# Patient Record
Sex: Female | Born: 1960 | Race: White | Hispanic: No | Marital: Married | Smoking: Never smoker
Health system: Southern US, Community
[De-identification: ages and names within clinical notes are randomized; demographics above are authoritative.]

## PROBLEM LIST (undated history)

## (undated) DIAGNOSIS — K219 Gastro-esophageal reflux disease without esophagitis: Secondary | ICD-10-CM

## (undated) DIAGNOSIS — M3500 Sicca syndrome, unspecified: Secondary | ICD-10-CM

## (undated) DIAGNOSIS — R87619 Unspecified abnormal cytological findings in specimens from cervix uteri: Secondary | ICD-10-CM

## (undated) DIAGNOSIS — S92909A Unspecified fracture of unspecified foot, initial encounter for closed fracture: Secondary | ICD-10-CM

## (undated) DIAGNOSIS — M069 Rheumatoid arthritis, unspecified: Secondary | ICD-10-CM

## (undated) DIAGNOSIS — L920 Granuloma annulare: Secondary | ICD-10-CM

## (undated) DIAGNOSIS — R000 Tachycardia, unspecified: Secondary | ICD-10-CM

## (undated) DIAGNOSIS — O039 Complete or unspecified spontaneous abortion without complication: Secondary | ICD-10-CM

## (undated) HISTORY — DX: Unspecified abnormal cytological findings in specimens from cervix uteri: R87.619

## (undated) HISTORY — DX: Rheumatoid arthritis, unspecified: M06.9

## (undated) HISTORY — DX: Tachycardia, unspecified: R00.0

## (undated) HISTORY — DX: Granuloma annulare: L92.0

## (undated) HISTORY — DX: Gastro-esophageal reflux disease without esophagitis: K21.9

## (undated) HISTORY — DX: Complete or unspecified spontaneous abortion without complication: O03.9

## (undated) HISTORY — PX: TONSILLECTOMY: SUR1361

## (undated) HISTORY — PX: DILATION AND CURETTAGE OF UTERUS: SHX78

## (undated) HISTORY — DX: Unspecified fracture of unspecified foot, initial encounter for closed fracture: S92.909A

## (undated) HISTORY — PX: COLPOSCOPY: SHX161

## (undated) HISTORY — DX: Sjogren syndrome, unspecified: M35.00

---

## 1998-03-04 ENCOUNTER — Emergency Department (HOSPITAL_COMMUNITY): Admission: EM | Admit: 1998-03-04 | Discharge: 1998-03-04 | Payer: Self-pay | Admitting: Emergency Medicine

## 1998-12-05 ENCOUNTER — Emergency Department (HOSPITAL_COMMUNITY): Admission: EM | Admit: 1998-12-05 | Discharge: 1998-12-06 | Payer: Self-pay

## 2000-04-19 ENCOUNTER — Encounter: Admission: RE | Admit: 2000-04-19 | Discharge: 2000-04-19 | Payer: Self-pay | Admitting: Family Medicine

## 2000-04-19 ENCOUNTER — Encounter: Payer: Self-pay | Admitting: Family Medicine

## 2000-05-19 ENCOUNTER — Encounter: Admission: RE | Admit: 2000-05-19 | Discharge: 2000-05-19 | Payer: Self-pay | Admitting: Family Medicine

## 2000-05-19 ENCOUNTER — Encounter: Payer: Self-pay | Admitting: Family Medicine

## 2001-02-07 ENCOUNTER — Encounter: Payer: Self-pay | Admitting: Gastroenterology

## 2001-02-07 ENCOUNTER — Encounter: Admission: RE | Admit: 2001-02-07 | Discharge: 2001-02-07 | Payer: Self-pay | Admitting: Gastroenterology

## 2001-05-13 ENCOUNTER — Encounter: Admission: RE | Admit: 2001-05-13 | Discharge: 2001-05-13 | Payer: Self-pay | Admitting: *Deleted

## 2002-01-11 ENCOUNTER — Encounter: Admission: RE | Admit: 2002-01-11 | Discharge: 2002-01-11 | Payer: Self-pay | Admitting: Gastroenterology

## 2002-01-11 ENCOUNTER — Encounter: Payer: Self-pay | Admitting: Gastroenterology

## 2002-08-07 ENCOUNTER — Encounter: Admission: RE | Admit: 2002-08-07 | Discharge: 2002-08-07 | Payer: Self-pay | Admitting: *Deleted

## 2002-10-03 ENCOUNTER — Other Ambulatory Visit: Admission: RE | Admit: 2002-10-03 | Discharge: 2002-10-03 | Payer: Self-pay | Admitting: *Deleted

## 2003-08-09 ENCOUNTER — Encounter: Admission: RE | Admit: 2003-08-09 | Discharge: 2003-08-09 | Payer: Self-pay | Admitting: Family Medicine

## 2003-09-14 ENCOUNTER — Ambulatory Visit (HOSPITAL_COMMUNITY): Admission: RE | Admit: 2003-09-14 | Discharge: 2003-09-14 | Payer: Self-pay | Admitting: Gastroenterology

## 2003-09-14 ENCOUNTER — Encounter (INDEPENDENT_AMBULATORY_CARE_PROVIDER_SITE_OTHER): Payer: Self-pay | Admitting: *Deleted

## 2003-10-09 ENCOUNTER — Other Ambulatory Visit: Admission: RE | Admit: 2003-10-09 | Discharge: 2003-10-09 | Payer: Self-pay | Admitting: *Deleted

## 2004-08-26 ENCOUNTER — Ambulatory Visit (HOSPITAL_COMMUNITY): Admission: RE | Admit: 2004-08-26 | Discharge: 2004-08-26 | Payer: Self-pay | Admitting: *Deleted

## 2005-08-20 ENCOUNTER — Emergency Department (HOSPITAL_COMMUNITY): Admission: EM | Admit: 2005-08-20 | Discharge: 2005-08-20 | Payer: Self-pay | Admitting: Emergency Medicine

## 2005-08-28 ENCOUNTER — Encounter: Admission: RE | Admit: 2005-08-28 | Discharge: 2005-08-28 | Payer: Self-pay | Admitting: *Deleted

## 2006-09-03 ENCOUNTER — Ambulatory Visit (HOSPITAL_COMMUNITY): Admission: RE | Admit: 2006-09-03 | Discharge: 2006-09-03 | Payer: Self-pay | Admitting: *Deleted

## 2007-09-09 ENCOUNTER — Ambulatory Visit (HOSPITAL_COMMUNITY): Admission: RE | Admit: 2007-09-09 | Discharge: 2007-09-09 | Payer: Self-pay | Admitting: Family Medicine

## 2008-09-17 ENCOUNTER — Ambulatory Visit (HOSPITAL_COMMUNITY): Admission: RE | Admit: 2008-09-17 | Discharge: 2008-09-17 | Payer: Self-pay | Admitting: Obstetrics & Gynecology

## 2009-09-23 ENCOUNTER — Ambulatory Visit (HOSPITAL_COMMUNITY): Admission: RE | Admit: 2009-09-23 | Discharge: 2009-09-23 | Payer: Self-pay | Admitting: Obstetrics & Gynecology

## 2010-03-04 ENCOUNTER — Ambulatory Visit: Payer: Self-pay | Admitting: Family Medicine

## 2010-03-04 DIAGNOSIS — M069 Rheumatoid arthritis, unspecified: Secondary | ICD-10-CM | POA: Insufficient documentation

## 2010-03-04 DIAGNOSIS — K219 Gastro-esophageal reflux disease without esophagitis: Secondary | ICD-10-CM | POA: Insufficient documentation

## 2010-03-04 DIAGNOSIS — M3500 Sicca syndrome, unspecified: Secondary | ICD-10-CM | POA: Insufficient documentation

## 2010-03-06 ENCOUNTER — Encounter: Payer: Self-pay | Admitting: Family Medicine

## 2010-03-07 LAB — CONVERTED CEMR LAB
Alkaline Phosphatase: 52 units/L (ref 39–117)
BUN: 12 mg/dL (ref 6–23)
CO2: 25 meq/L (ref 19–32)
Cholesterol: 147 mg/dL (ref 0–200)
Creatinine, Ser: 0.85 mg/dL (ref 0.40–1.20)
Glucose, Bld: 84 mg/dL (ref 70–99)
HDL: 56 mg/dL (ref >39)
LDL Cholesterol: 82 mg/dL (ref 0–99)
Total Bilirubin: 0.7 mg/dL (ref 0.3–1.2)
Total CHOL/HDL Ratio: 2.6
Total Protein: 6.2 g/dL (ref 6.0–8.3)
Triglycerides: 45 mg/dL (ref <150)
VLDL: 9 mg/dL (ref 0–40)

## 2010-03-31 ENCOUNTER — Ambulatory Visit: Payer: Self-pay | Admitting: Family Medicine

## 2010-03-31 LAB — CONVERTED CEMR LAB
Ketones, urine, test strip: NEGATIVE
Protein, U semiquant: NEGATIVE
Specific Gravity, Urine: 1.01

## 2010-04-01 ENCOUNTER — Encounter: Payer: Self-pay | Admitting: Family Medicine

## 2010-05-05 ENCOUNTER — Other Ambulatory Visit: Admission: RE | Admit: 2010-05-05 | Discharge: 2010-05-05 | Payer: Self-pay | Admitting: Family Medicine

## 2010-05-05 ENCOUNTER — Ambulatory Visit: Payer: Self-pay | Admitting: Family Medicine

## 2010-05-05 LAB — CONVERTED CEMR LAB: Pap Smear: NEGATIVE

## 2010-05-06 LAB — CONVERTED CEMR LAB
Trich, Wet Prep: NONE SEEN
WBC, Wet Prep HPF POC: NONE SEEN
Yeast Wet Prep HPF POC: NONE SEEN

## 2010-06-02 ENCOUNTER — Telehealth (INDEPENDENT_AMBULATORY_CARE_PROVIDER_SITE_OTHER): Payer: Self-pay | Admitting: *Deleted

## 2010-06-02 ENCOUNTER — Telehealth: Payer: Self-pay | Admitting: Family Medicine

## 2010-09-04 ENCOUNTER — Ambulatory Visit
Admission: RE | Admit: 2010-09-04 | Discharge: 2010-09-04 | Payer: Self-pay | Source: Home / Self Care | Attending: Family Medicine | Admitting: Family Medicine

## 2010-09-04 ENCOUNTER — Encounter: Payer: Self-pay | Admitting: Family Medicine

## 2010-09-04 DIAGNOSIS — N952 Postmenopausal atrophic vaginitis: Secondary | ICD-10-CM | POA: Insufficient documentation

## 2010-09-04 LAB — CONVERTED CEMR LAB
Clue Cells Wet Prep HPF POC: NONE SEEN
Yeast Wet Prep HPF POC: NONE SEEN

## 2010-09-05 ENCOUNTER — Ambulatory Visit
Admission: RE | Admit: 2010-09-05 | Discharge: 2010-09-05 | Payer: Self-pay | Source: Home / Self Care | Attending: Family Medicine | Admitting: Family Medicine

## 2010-09-05 DIAGNOSIS — N390 Urinary tract infection, site not specified: Secondary | ICD-10-CM | POA: Insufficient documentation

## 2010-09-05 LAB — CONVERTED CEMR LAB
Bilirubin Urine: NEGATIVE
Ketones, urine, test strip: NEGATIVE
Protein, U semiquant: NEGATIVE
pH: 5

## 2010-09-08 ENCOUNTER — Other Ambulatory Visit: Payer: Self-pay | Admitting: Family Medicine

## 2010-09-08 DIAGNOSIS — Z1239 Encounter for other screening for malignant neoplasm of breast: Secondary | ICD-10-CM

## 2010-09-08 DIAGNOSIS — Z1231 Encounter for screening mammogram for malignant neoplasm of breast: Secondary | ICD-10-CM

## 2010-09-09 NOTE — Progress Notes (Signed)
Summary: estrace cream helping  Phone Note Call from Patient   Caller: Patient Call For: [name] MD Summary of Call: pt called and states the cream we rx'ed is helping her sx Initial call taken by: Avon Gully CMA, Duncan Dull),  June 02, 2010 3:35 PM

## 2010-09-09 NOTE — Letter (Signed)
Summary: Records Dated 09-02-05 thru 07-26-09/[facility]  Records Dated 09-02-05 thru 07-26-09/[facility]   Imported By: Lanelle Bal 04/04/2010 13:01:20  _____________________________________________________________________  External Attachment:    Type:   Image     Comment:   External Document

## 2010-09-09 NOTE — Assessment & Plan Note (Signed)
Summary: pap, etc   Vital Signs:  Patient profile:   50 year old female Height:      68 inches Weight:      123 pounds Pulse rate:   76 / minute BP sitting:   114 / 70  (right arm) Cuff size:   regular  Vitals Entered By: Avon Gully CMA, Duncan Dull) (May 05, 2010 9:15 AM) CC: Pap only   Primary Care Sadee Osland:  [name], MD  CC:  Pap only.  History of Present Illness: Her for pap only. Had her CPE in July. Still having pain with intercourse. She was given estrace cream to try form her Urologist. Hasn't started it  yet. Wanted to ask me about it. first. Also having some brest soreness adn periods are irregular. No discharge.   Current Medications (verified): 1)  Restasis 0.05 % Emul (Cyclosporine) .... 2 X Daily 2)  Calcium-Vitamin D 250-125 Mg-Unit Tabs (Calcium Carbonate-Vitamin D) 3)  Fish Oil 1000 Mg Caps (Omega-3 Fatty Acids) 4)  Align  Caps (Probiotic Product) 5)  Ranitidine Hcl 150 Mg Tabs (Ranitidine Hcl) .... Take 1 Tablet By Mouth Two Times A Day 6)  Sulfamethoxazole-Trimethoprim 800-160 Mg/24ml Susp (Sulfamethoxazole-Trimethoprim) .... Take 1 Tablet By Mouth Two Times A Day For 3 Days  Allergies (verified): 1)  ! * Naproxyn  Comments:  Nurse/Medical Assistant: The patient's medications and allergies were reviewed with the patient and were updated in the Medication and Allergy Lists. Avon Gully CMA, Duncan Dull) (May 05, 2010 9:15 AM)  Physical Exam  General:  Well-developed,well-nourished,in no acute distress; alert,appropriate and cooperative throughout examination Head:  Normocephalic and atraumatic without obvious abnormalities. No apparent alopecia or balding. Genitalia:  Normal introitus for age, no external lesions. White vaginal discharge.  Mucosa pink and moist, no vaginal or cervical lesions, no vaginal atrophy, no friaility or hemorrhage, normal uterus size and position, no adnexal masses or tenderness   Impression &  Recommendations:  Problem # 1:  SCREENING FOR MALIGNANT NEOPLASM OF THE CERVIX (ICD-V76.2) Will call with results later this week. Ifnormal repeat in 2 years.   Problem # 2:  DYSPAREUNIA (ICD-625.0) Trial of the estrace cream. If not helping after 1 month then let me know.  Also discussed bioidenticals to help with her perimenopausal sxs. She will think about it.   Complete Medication List: 1)  Restasis 0.05 % Emul (Cyclosporine) .... 2 x daily 2)  Calcium-vitamin D 250-125 Mg-unit Tabs (Calcium carbonate-vitamin d) 3)  Fish Oil 1000 Mg Caps (Omega-3 fatty acids) 4)  Align Caps (Probiotic product) 5)  Ranitidine Hcl 150 Mg Tabs (Ranitidine hcl) .... Take 1 tablet by mouth two times a day 6)  Sulfamethoxazole-trimethoprim 800-160 Mg/25ml Susp (Sulfamethoxazole-trimethoprim) .... Take 1 tablet by mouth two times a day for 3 days  Other Orders: T-Wet Prep for Christoper Allegra, [facility] [phone])  Appended Document: pap, etc Call pt: Pap smear normal- Return for  Pap in 3 years October  3, 20114:46 PM [place] MD, Santina Evans  05/12/2010 @ 4:46pm- Pt notified of results. KJ LPN

## 2010-09-09 NOTE — Progress Notes (Signed)
----   Converted from flag ---- ---- 05/05/2010 9:44 AM, [name] MD wrote: See if estrace cream is helping her sxs. ------------------------------  06/02/10 8:23 called and left message to let us know if cream was helping.acm

## 2010-09-09 NOTE — Assessment & Plan Note (Signed)
Summary: NOV: CPE   Vital Signs:  Patient profile:   50 year old female Height:      68 inches Weight:      125 pounds BMI:     19.07 Pulse rate:   62 / minute BP sitting:   115 / 71  (left arm) Cuff size:   regular  Vitals Entered By: Avon Gully CMA, Duncan Dull) (March 04, 2010 8:38 AM) CC: NP establish care Is Patient Diabetic? No   Primary Care Provider:  [name], MD  CC:  NP establish care.  History of Present Illness: Hx of RA.  Also has sjogrens syndroms, GERD relieved by Prevacid.  Get discomforrt in her epigastric area if doesn't take her prevacid.  Otherwise doesn't have any daytime symptoms.    Had some spottingi n May but thinks she is perimenopausal.  Micah Flesher  to her GYN for this. Get some insomnia at nightime. Not really having nightsweats or hot flahses. Occ will use Advil PM and Unisom and this does seem to help.   Habits & Providers  Alcohol-Tobacco-Diet     Alcohol drinks/day: 0     Tobacco Status: never  Exercise-Depression-Behavior     Does Patient Exercise: yes     Type of exercise: yoga,walking     Times/week: everyday     Have you felt down or hopeless? no     Have you felt little pleasure in things? no     STD Risk: never     Drug Use: no     Seat Belt Use: always  Current Medications (verified): 1)  Restasis 0.05 % Emul (Cyclosporine) .... 2 X Daily 2)  Calcium-Vitamin D 250-125 Mg-Unit Tabs (Calcium Carbonate-Vitamin D) 3)  Fish Oil 1000 Mg Caps (Omega-3 Fatty Acids) 4)  Align  Caps (Probiotic Product)  Allergies (verified): 1)  ! * Naproxyn  Comments:  Nurse/Medical Assistant: The patient's medications and allergies were reviewed with the patient and were updated in the Medication and Allergy Lists. Avon Gully CMA, Duncan Dull) (March 04, 2010 8:52 AM)  Past History:  Past Medical History: Rheumatologist - Dr. Letitia Libra. Hx of granuloma annulare' Stress test in 01/2009 at [facility]  Past Surgical  History: Tonsillectomy  D & C  Family History: GF and GM wiht MI Father with HTN MG with stroke.   Social History: Yoga Instructor, self employed Married to [place] with 2 kids.   Never Smoked Alcohol use-no Drug use-no Regular exercise-yes Smoking Status:  never Does Patient Exercise:  yes STD Risk:  never Drug Use:  no Seat Belt Use:  always  Review of Systems       No fever/sweats/weakness, unexplained weight loss/gain.  No vison changes.  + difficulty hearing/ringing in ears, hay fever/allergies.  No chest pain/discomfort, palpitations.  No Br lump/nipple discharge.  No cough/wheeze.  No blood in BM, nausea/vomiting/diarrhea.  No nighttime urination, leaking urine, unusual vaginal bleeding, discharge (penis or vagina).  No muscle/joint pain. No rash, change in mole.  No HA, memory loss.  No anxiety, + sleep d/o,  no depression.  No easy bruising/bleeding, unexplained lump   Physical Exam  General:  Well-developed,well-nourished,in no acute distress; alert,appropriate and cooperative throughout examination Head:  Normocephalic and atraumatic without obvious abnormalities. No apparent alopecia or balding. Eyes:  No corneal or conjunctival inflammation noted. EOMI. Perrla. Ears:  External ear exam shows no significant lesions or deformities.  Otoscopic examination reveals clear canals, tympanic membranes are intact bilaterally without bulging, retraction, inflammation or discharge. Hearing  is grossly normal bilaterally. Nose:  External nasal examination shows no deformity or inflammation. Nasal mucosa are pink and moist without lesions or exudates. Mouth:  Oral mucosa and oropharynx without lesions or exudates.  Teeth in good repair. Neck:  No deformities, masses, or tenderness noted. Chest Wall:  No deformities, masses, or tenderness noted. Lungs:  Normal respiratory effort, chest expands symmetrically. Lungs are clear to auscultation, no crackles or wheezes. Heart:  Normal rate  and regular rhythm. S1 and S2 normal without gallop, murmur, click, rub or other extra sounds. Abdomen:  Bowel sounds positive,abdomen soft and non-tender without masses, organomegaly or hernias noted. Msk:  No deformity or scoliosis noted of thoracic or lumbar spine.   Pulses:  R and L carotid,radial,dorsalis pedis and posterior tibial pulses are full and equal bilaterally Extremities:  No clubbing, cyanosis, edema, or deformity noted with normal full range of motion of all joints.   Neurologic:  No cranial nerve deficits noted. Station and gait are normal. . Sensory, motor and coordinative functions appear intact. Skin:  no rashes.   Cervical Nodes:  No lymphadenopathy noted Psych:  Cognition and judgment appear intact. Alert and cooperative with normal attention span and concentration. No apparent delusions, illusions, hallucinations   Impression & Recommendations:  Problem # 1:  HEALTH MAINTENANCE EXAM (ICD-V70.0)  Colonoscopy and Tdap are up to date.  Pap and mammo are up to date via her GYN.   due for screenig labs Continue regular exercise and healthy diet.  Canconsider progesterone capsules at bedtime or a sleep aid if would like. Pt will let me know.  Orders: T-Comprehensive Metabolic Panel [phone]) T-Lipid Profile ([id number]) T-TSH [phone])  Problem # 2:  GERD (ICD-530.81)  Will change from PPI to H2 blocker to lower risk of osteoporosis.  Start ranitidine 150mg  two times a day. If not controlling her sxs then will change back to prevacid. If doing well then can try to wean down to 75mg  two times a day after 1-2 months.   Her updated medication list for this problem includes:    Ranitidine Hcl 150 Mg Tabs (Ranitidine hcl) .Marland Kitchen... Take 1 tablet by mouth two times a day  Complete Medication List: 1)  Restasis 0.05 % Emul (Cyclosporine) .... 2 x daily 2)  Calcium-vitamin D 250-125 Mg-unit Tabs (Calcium carbonate-vitamin d) 3)  Fish Oil 1000 Mg Caps (Omega-3 fatty  acids) 4)  Align Caps (Probiotic product) 5)  Ranitidine Hcl 150 Mg Tabs (Ranitidine hcl) .... Take 1 tablet by mouth two times a day  Patient Instructions: 1)  Try changing to ranitidine for your reflux. If it is not working we can change you back to prevacid. You may have a little rebound for the first week 2)  We will call you with your lab results about 2-3 days after you go to the lab. Prescriptions: RANITIDINE HCL 150 MG TABS (RANITIDINE HCL) Take 1 tablet by mouth two times a day  #60 x 3   Entered and Authorized by:   [name] MD   Signed by:   [name] MD on 03/04/2010   Method used:   Electronically to        [facility]  [address] [facility] [phone][facility] (retail)       [address] [place], [place]  [address]       Ph: [phone] or [phone]       Fax: [phone]   RxID:  [id number]   Prevention & Chronic Care Immunizations   Influenza vaccine: Not documented    Tetanus booster: 03/31/2004: given   Tetanus booster due: 03/31/2014    Pneumococcal vaccine: Not documented  Colorectal Screening   Hemoccult: Not documented   Hemoccult due: Not Indicated    Colonoscopy: normal  (09/14/2003)   Colonoscopy due: 09/13/2013  Other Screening   Pap smear: normal  (04/16/2009)   Pap smear due: 04/16/2010    Mammogram: normal  (09/23/2009)   Mammogram due: 09/23/2010   Smoking status: never  (03/04/2010)  Lipids   Total Cholesterol: Not documented   LDL: Not documented   LDL Direct: Not documented   HDL: Not documented   Triglycerides: Not documented   TD Result Date:  03/31/2004 TD Result:  given Flex Sig Next Due:  Not Indicated Colonoscopy Result Date:  09/14/2003 Colonoscopy Result:  normal Hemoccult Next Due:  Not Indicated PAP Result Date:  04/16/2009 PAP Result:  normal PAP Next Due:  1 yr Mammogram Result Date:  09/23/2009 Mammogram Result:  normal Mammogram Next Due:  1 yr  Appended Document: NOV: CPE

## 2010-09-09 NOTE — Progress Notes (Signed)
Summary: Med & Problem List Brought by Patient  Med & Problem List Brought by Patient   Imported By: Lanelle Bal 03/10/2010 11:14:47  _____________________________________________________________________  External Attachment:    Type:   Image     Comment:   External Document

## 2010-09-09 NOTE — Assessment & Plan Note (Signed)
Summary: bladder issues   Vital Signs:  Patient profile:   50 year old female Height:      68 inches Weight:      120 pounds Pulse rate:   95 / minute BP sitting:   120 / 75  (left arm) Cuff size:   regular  Vitals Entered By: Avon Gully CMA, Duncan Dull) (March 31, 2010 9:52 AM) CC: frequent urination x 1 weekor mor, nld in urine this am, pressure with urination,painful intercourse lat sund, has appt with urologist   Primary Care Provider:  [name], MD  CC:  frequent urination x 1 weekor mor, nld in urine this am, pressure with urination, painful intercourse lat sund, and has appt with urologist.  History of Present Illness: 50 yo ago saw Urology for UTI.  Recommended she take Preleve.  Occ will have flares where feels like getting UTI. Doesn't usually do anything about it unless sees blood. Saw blood in urine about a week ago and had pain when had sex. Then notices pressure and urinary frquency.  Tuesday and Wednesday saw blood.  Saw GYN and saw micro blood and told to f/u with  urology.  Her Urologist doesn't have an appt until med september.  Was dx with IC.  Says really hasn't flared in a long time.  She brought in a bladder diarry. Usualy goes about 17 times a day.   Current Medications (verified): 1)  Restasis 0.05 % Emul (Cyclosporine) .... 2 X Daily 2)  Calcium-Vitamin D 250-125 Mg-Unit Tabs (Calcium Carbonate-Vitamin D) 3)  Fish Oil 1000 Mg Caps (Omega-3 Fatty Acids) 4)  Align  Caps (Probiotic Product) 5)  Ranitidine Hcl 150 Mg Tabs (Ranitidine Hcl) .... Take 1 Tablet By Mouth Two Times A Day  Allergies (verified): 1)  ! * Naproxyn  Comments:  Nurse/Medical Assistant: The patient's medications and allergies were reviewed with the patient and were updated in the Medication and Allergy Lists. Avon Gully CMA, Duncan Dull) (March 31, 2010 10:03 AM)  Physical Exam  General:  Well-developed,well-nourished,in no acute distress; alert,appropriate and cooperative  throughout examination   Impression & Recommendations:  Problem # 1:  URINARY FREQUENCY (ICD-788.41) Will send for cultlure and will go ahead and tx for now. Discused risks of ABX exposure.  Will call with culture results in 3 days. She already has Urology appt. This could also be a IC flare. Can also try AZO standard OTC for comfort.  Orders: UA Dipstick w/o Micro (automated)  (81003) T-Urine Culture (Spectrum Order) [id number])  Complete Medication List: 1)  Restasis 0.05 % Emul (Cyclosporine) .... 2 x daily 2)  Calcium-vitamin D 250-125 Mg-unit Tabs (Calcium carbonate-vitamin d) 3)  Fish Oil 1000 Mg Caps (Omega-3 fatty acids) 4)  Align Caps (Probiotic product) 5)  Ranitidine Hcl 150 Mg Tabs (Ranitidine hcl) .... Take 1 tablet by mouth two times a day 6)  Sulfamethoxazole-trimethoprim 800-160 Mg/48ml Susp (Sulfamethoxazole-trimethoprim) .... Take 1 tablet by mouth two times a day for 3 days Prescriptions: SULFAMETHOXAZOLE-TRIMETHOPRIM 800-160 MG/20ML SUSP (SULFAMETHOXAZOLE-TRIMETHOPRIM) Take 1 tablet by mouth two times a day for 3 days  #6 x 0   Entered and Authorized by:   [name] MD   Signed by:   [name] MD on 03/31/2010   Method used:   [facility] to        [facility]  [address] [phone][facility] ([facility]ail)       [address]       [place],  [place]  [address]       Ph: [phone] or [phone]       Fax: [phone]   RxID:   [id number]   Laboratory Results   Urine Tests  Date/Time Received: 03/31/10 Date/Time Reported: 03/31/10  Routine Urinalysis   Color: yellow Appearance: Clear Glucose: negative   (Normal Range: Negative) Bilirubin: negative   (Normal Range: Negative) Ketone: negative   (Normal Range: Negative) Spec. Gravity: 1.010   (Normal Range: 1.003-1.035) Blood: moderate   (Normal Range: Negative) pH: 7.0   (Normal Range: 5.0-8.0) Protein: negative   (Normal Range: Negative) Urobilinogen: 0.2   (Normal Range: 0-1) Nitrite:  negative   (Normal Range: Negative) Leukocyte Esterace: large   (Normal Range: Negative)

## 2010-09-11 NOTE — Assessment & Plan Note (Signed)
Summary: vaginitis   Vital Signs:  Patient profile:   50 year old female Height:      68 inches Weight:      122 pounds Pulse rate:   97 / minute BP sitting:   109 / 66  (right arm) Cuff size:   regular  Vitals Entered By: Avon Gully CMA, Duncan Dull) (September 04, 2010 11:26 AM) CC: bleeding after intercourse,fishy smell   Primary Care Provider:  [name], MD  CC:  bleeding after intercourse and fishy smell.  History of Present Illness: bleeding after intercourse,fishy smell.  she does have past history of pain with intercourse and we started Estrace cream.  Using the estrace and that had really helped.  she was on vacation last week and she and her husband were able to have intercourse more frequently than usual.  She became worried because she Started having alot of blood after intercourse.  So not sure if it was a periods or not since she is irregular.  Then bled for a couple days. she also noticed that her vaginal discharge had a fishy odor to it.  She says that is actually low better this week but is still not completely normal.  Had a normlapap in Oct 2011.    Current Medications (verified): 1)  Restasis 0.05 % Emul (Cyclosporine) .... 2 X Daily 2)  Calcium-Vitamin D 250-125 Mg-Unit Tabs (Calcium Carbonate-Vitamin D) 3)  Fish Oil 1000 Mg Caps (Omega-3 Fatty Acids) 4)  Align  Caps (Probiotic Product) 5)  Ranitidine Hcl 150 Mg Tabs (Ranitidine Hcl) .... Take 1 Tablet By Mouth Two Times A Day  Allergies (verified): 1)  ! * Naproxyn  Comments:  Nurse/Medical Assistant: The patient's medications and allergies were reviewed with the patient and were updated in the Medication and Allergy Lists. Avon Gully CMA, Duncan Dull) (September 04, 2010 11:27 AM)  Physical Exam  General:  Well-developed,well-nourished,in no acute distress; alert,appropriate and cooperative throughout examination Genitalia:  Normal introitus for age, no external lesions, no vaginal discharge,  mucosa pink and moist, no vaginal or cervical lesions, no vaginal atrophy, no friaility or hemorrhage,   Impression & Recommendations:  Problem # 1:  VAGINITIS (ICD-616.10) anatomy lesions on her pelvic exam.  Also I did not intend for any polyps that he could be the cause of her bleeding.  Likely she just started to have a normal period.  Since these are irregular for her this week for a little bit difficult to tell when she's actually due for her period.  We will send a self wet prep determine if she does have BV which is likely the cause of the fishy odor.  We should have the results back tomorrow and will give her a call with the results. The following medications were removed from the medication list:    Sulfamethoxazole-trimethoprim 800-160 Mg/52ml Susp (Sulfamethoxazole-trimethoprim) .Marland Kitchen... Take 1 tablet by mouth two times a day for 3 days  Orders: T-Wet Prep for Christoper Allegra, [facility] [phone])  Complete Medication List: 1)  Restasis 0.05 % Emul (Cyclosporine) .... 2 x daily 2)  Calcium-vitamin D 250-125 Mg-unit Tabs (Calcium carbonate-vitamin d) 3)  Fish Oil 1000 Mg Caps (Omega-3 fatty acids) 4)  Align Caps (Probiotic product) 5)  Ranitidine Hcl 150 Mg Tabs (Ranitidine hcl) .... Take 1 tablet by mouth two times a day 6)  Estrace 0.1 Mg/gm Crea (Estradiol)   Orders Added: 1)  T-Wet Prep for Christoper Allegra, [facility] [[id number]] 2)  Est. Patient Level III [30865]

## 2010-09-11 NOTE — Assessment & Plan Note (Signed)
Summary: UTI  Nurse Visit    Impression & Recommendations:  Problem # 1:  UTI (ICD-599.0)  Encouraged to push clear liquids, get enough rest, and take acetaminophen as needed. To be seen in 10 days if no improvement, sooner if worse.  Her updated medication list for this problem includes:    Bactrim Ds 800-160 Mg Tabs (Sulfamethoxazole-trimethoprim) .Marland Kitchen... Take 1 tablet by mouth two times a day for 3 days  Orders: UA Dipstick w/o Micro (automated)  (81003)  Complete Medication List: 1)  Restasis 0.05 % Emul (Cyclosporine) .... 2 x daily 2)  Calcium-vitamin D 250-125 Mg-unit Tabs (Calcium carbonate-vitamin d) 3)  Fish Oil 1000 Mg Caps (Omega-3 fatty acids) 4)  Align Caps (Probiotic product) 5)  Ranitidine Hcl 150 Mg Tabs (Ranitidine hcl) .... Take 1 tablet by mouth two times a day 6)  Estrace 0.1 Mg/gm Crea (Estradiol) 7)  Bactrim Ds 800-160 Mg Tabs (Sulfamethoxazole-trimethoprim) .... Take 1 tablet by mouth two times a day for 3 days    Allergies: 1)  ! * Naproxyn Laboratory Results   Urine Tests  Date/Time Received: 09/05/17 Date/Time Reported: 09/05/17  Routine Urinalysis   Color: yellow Glucose: 100   (Normal Range: Negative) Bilirubin: negative   (Normal Range: Negative) Ketone: negative   (Normal Range: Negative) Spec. Gravity: <1.005   (Normal Range: 1.003-1.035) Blood: small   (Normal Range: Negative) pH: 5.0   (Normal Range: 5.0-8.0) Protein: negative   (Normal Range: Negative) Urobilinogen: 0.2   (Normal Range: 0-1) Nitrite: positive   (Normal Range: Negative) Leukocyte Esterace: small   (Normal Range: Negative)       Orders Added: 1)  UA Dipstick w/o Micro (automated)  [81003] 2)  Est. Patient Level I [16109] 3)  Est. Patient Level I [60454] Prescriptions: BACTRIM DS 800-160 MG TABS (SULFAMETHOXAZOLE-TRIMETHOPRIM) Take 1 tablet by mouth two times a day for 3 days  #6 x 0   Entered and Authorized by:   [name] MD   Signed by:    [name] MD on 09/05/2010   Method used:   [facility] to        [facility]  [address] [phone][facility] ([facility]ail[facility]       [address] [place], [place]  [address]       Ph: [phone] or [phone]       Fax: [phone]   RxID:   [id number]   4:33 PM September 05, 2010 [place] CMA, Duncan Dull), Sue Lush pt notified

## 2010-09-24 ENCOUNTER — Ambulatory Visit (HOSPITAL_COMMUNITY)
Admission: RE | Admit: 2010-09-24 | Discharge: 2010-09-24 | Disposition: A | Discharge status: Home / Self Care | Payer: BC Managed Care – PPO | Source: Ambulatory Visit | Attending: Family Medicine | Admitting: Family Medicine

## 2010-09-24 DIAGNOSIS — Z1231 Encounter for screening mammogram for malignant neoplasm of breast: Secondary | ICD-10-CM

## 2010-12-24 ENCOUNTER — Other Ambulatory Visit: Payer: Self-pay | Admitting: Family Medicine

## 2011-02-03 ENCOUNTER — Encounter: Payer: Self-pay | Admitting: Family Medicine

## 2011-02-03 ENCOUNTER — Ambulatory Visit (INDEPENDENT_AMBULATORY_CARE_PROVIDER_SITE_OTHER): Payer: BC Managed Care – PPO | Admitting: Family Medicine

## 2011-02-03 DIAGNOSIS — M3500 Sicca syndrome, unspecified: Secondary | ICD-10-CM

## 2011-02-03 DIAGNOSIS — R5381 Other malaise: Secondary | ICD-10-CM

## 2011-02-03 DIAGNOSIS — R5383 Other fatigue: Secondary | ICD-10-CM

## 2011-02-03 NOTE — Progress Notes (Signed)
  Subjective:    Patient ID: Marissa Underwood, female    DOB: 09-07-1960, 50 y.o.   MRN: [id number]  HPI  50yo WF presents for feeling like she cannot get enough air in in the evenings with frequent yawning.  This started about 5 days ago but 3 wks ago she started to feel more winded when she was working out.  She has lost a little bit of weight w/o changing her diet.  She is vegetarian but has a limited diet.  She takes Mag, Vit D and Calcium.  She had an increase in gas for a week but this has improved.  Denies any blood in her stool.  She had a mammogram this year and a colonoscopy 7 yrs ago for IBS.  She denies any heartburn or chest tightness.    BP 115/75  Pulse 89  Temp(Src) 98.4 F (36.9 C) (Oral)  Ht 5\' 8"  (1.727 m)  Wt 121 lb (54.885 kg)  BMI 18.40 kg/m2  SpO2 100%  Review of Systems  Constitutional: Positive for fatigue. Negative for fever, chills, activity change, appetite change and unexpected weight change.  HENT: Negative for neck pain.   Respiratory: Positive for shortness of breath. Negative for cough, chest tightness and wheezing.   Cardiovascular: Positive for palpitations. Negative for chest pain and leg swelling.  Gastrointestinal: Positive for abdominal distention. Negative for nausea, abdominal pain, diarrhea, constipation and blood in stool.  Genitourinary: Negative for difficulty urinating.  Skin: Negative for rash.  Neurological: Positive for headaches. Negative for dizziness, light-headedness and numbness.  Psychiatric/Behavioral: Positive for sleep disturbance. Negative for dysphoric mood.       Objective:   Physical Exam  Constitutional: She appears well-developed and well-nourished. No distress.  HENT:  Mouth/Throat: Oropharynx is clear and moist.  Eyes: Conjunctivae are normal. No scleral icterus.  Neck: Neck supple. No thyromegaly present.  Cardiovascular: Normal rate, regular rhythm and normal heart sounds.   No murmur heard. Pulmonary/Chest: Effort  normal and breath sounds normal. No respiratory distress.  Musculoskeletal: She exhibits no edema.  Lymphadenopathy:    She has no cervical adenopathy.  Neurological:       No tremor  Skin: Skin is warm and dry. Pallor: palor of the conjunctiva.  Psychiatric: She has a normal mood and affect.          Assessment & Plan:  Subjective fatigue with increased yawning and ? SOB-  Reassured by excellent BP, SaO2 and lung exam.  Will check labs to r/o iron deficiency, B12 defic or thyroid dysfunction.  Reviewed dietary intake, perimenopausal symptoms and routine screening which is UTD.    If labs are normal, suggest adding a 2D echo & working on improving sleep.

## 2011-02-03 NOTE — Patient Instructions (Signed)
Labs downstairs today.  Will call you w/ results tomorrow.  Read thru info on iron rich foods.  Will schedule a 2D echo of your heart if labs come back normal.

## 2011-02-04 ENCOUNTER — Telehealth: Payer: Self-pay | Admitting: Family Medicine

## 2011-02-04 DIAGNOSIS — R0600 Dyspnea, unspecified: Secondary | ICD-10-CM

## 2011-02-04 LAB — CBC WITH DIFFERENTIAL/PLATELET
Eosinophils Relative: 1 % (ref 0–5)
HCT: 46.3 % — ABNORMAL HIGH (ref 36.0–46.0)
Lymphocytes Relative: 32 % (ref 12–46)
Lymphs Abs: 1.8 10*3/uL (ref 0.7–4.0)
MCV: 96.1 fL (ref 78.0–100.0)
Monocytes Absolute: 0.5 10*3/uL (ref 0.1–1.0)
Monocytes Relative: 8 % (ref 3–12)
RBC: 4.82 MIL/uL (ref 3.87–5.11)
WBC: 5.7 10*3/uL (ref 4.0–10.5)

## 2011-02-04 LAB — IRON AND TIBC
%SAT: 47 % (ref 20–55)
Iron: 175 ug/dL — ABNORMAL HIGH (ref 42–145)
TIBC: 375 ug/dL (ref 250–470)
UIBC: 200 ug/dL

## 2011-02-04 LAB — FERRITIN: Ferritin: 32 ng/mL (ref 10–291)

## 2011-02-04 NOTE — Telephone Encounter (Signed)
Pls let pt know that all of her labs came back normal.

## 2011-02-04 NOTE — Telephone Encounter (Signed)
Pt informed that her labs are all normal.  Pt said you had said if everything comes back normal that provider would order an echo cardiogram.  Please advise.  I did not see an order in the system. Plan:  Routed to Dr. Arlice Colt, LPN Dom[facility] Dimes

## 2011-02-04 NOTE — Telephone Encounter (Signed)
Order placed for 2D echo to be done at [facility].  Thanks.

## 2011-02-04 NOTE — Telephone Encounter (Signed)
Notified pt that the 2 D echo order had been placed in the system, and pt prefers to see Dr. Melburn Popper at [facility] for the 2 D echo. Plan:  Routed to Dr. Cathey Endow and to the referral coordinator. Jarvis Newcomer, LPN [facility] Dimes

## 2011-02-09 ENCOUNTER — Telehealth: Payer: Self-pay | Admitting: Family Medicine

## 2011-02-09 NOTE — Telephone Encounter (Signed)
Pt called and wanted to find out about her referral for 2 D echo with Dr. Melburn Popper.   Plan:  Reviewed chart and referral did not automatically roll over to Jennifer/referral coordinator work que.  Notified Victorino Dike to get pt referral sched at gave pt # to call her.  Informed the patient that someone should be calling her with appt/date/time. Jarvis Newcomer, LPN [facility] Dimes

## 2011-02-13 NOTE — Telephone Encounter (Signed)
Has this been ordered Marissa Underwood?

## 2011-02-24 ENCOUNTER — Ambulatory Visit (INDEPENDENT_AMBULATORY_CARE_PROVIDER_SITE_OTHER): Payer: BC Managed Care – PPO | Admitting: Cardiovascular Disease

## 2011-02-24 ENCOUNTER — Ambulatory Visit (HOSPITAL_BASED_OUTPATIENT_CLINIC_OR_DEPARTMENT_OTHER): Payer: BC Managed Care – PPO | Attending: Cardiovascular Disease

## 2011-02-24 ENCOUNTER — Encounter: Payer: Self-pay | Admitting: Cardiovascular Disease

## 2011-02-24 VITALS — BP 128/79 | HR 65 | Ht 68.0 in | Wt 123.6 lb

## 2011-02-24 DIAGNOSIS — R0689 Other abnormalities of breathing: Secondary | ICD-10-CM

## 2011-02-24 DIAGNOSIS — G4710 Hypersomnia, unspecified: Secondary | ICD-10-CM | POA: Insufficient documentation

## 2011-02-24 DIAGNOSIS — R0600 Dyspnea, unspecified: Secondary | ICD-10-CM | POA: Insufficient documentation

## 2011-02-24 DIAGNOSIS — R0609 Other forms of dyspnea: Secondary | ICD-10-CM

## 2011-02-24 NOTE — Patient Instructions (Signed)
We will call you with results of the sleep study.  Call sooner than your appointment  if you have questions or concerns.

## 2011-02-24 NOTE — Assessment & Plan Note (Addendum)
Marissa Underwood has had worsening dyspnea in the past several months. She also has been yawning and feels the need to take deep breaths [name]. I do not think that this is a true cardiac abnormality. She is in very good shape and exercises on a regular basis.  Her husband thinks that she doesn't sleep well at night. She gasps and snores at night. I suspect that she may have sleep apnea. We will order a split-night sleep study. If she does have sleep apnea, we will refer her to a pulmonologist.   I'll see her again in several months.`

## 2011-02-24 NOTE — Progress Notes (Signed)
Marissa Underwood Date of Birth  1961/07/15 [facility] / [facility] [address]     [address] [place], [place]  [address] [phone]  Fax  [phone]  History of Present Illness:  Marissa Underwood is a healthy middle age female.  Recent episodes of DOE.  Had an episode of yawning that lasted 5 days.  Not wheezing.  Not able to take as deep a breath as she would like.   Husband says she doesn't sleep well.  Occasional morning headaches and yawning. No PND or orthopnea  Current Outpatient Prescriptions on File Prior to Visit  Medication Sig Dispense Refill  . calcium-vitamin D (OSCAL) 250-125 MG-UNIT per tablet Take 1 tablet by mouth daily.        . cycloSPORINE (RESTASIS) 0.05 % ophthalmic emulsion 1 drop every 12 (twelve) hours.        Marland Kitchen estradiol (ESTRACE) 0.1 MG/GM vaginal cream Place 2 g vaginally daily.        . fish oil-omega-3 fatty acids 1000 MG capsule Take 1 g by mouth daily.        . Probiotic Product (ALIGN PO) Take by mouth.        . ranitidine (ZANTAC) 150 MG tablet TAKE 1 TABLET BY MOUTH TWO TIMES A DAY  60 tablet  4    Allergies  Allergen Reactions  . Naproxen     Past Medical History  Diagnosis Date  . Granuloma annulare     history of  . Chest tightness   . Tachycardia   . RA (rheumatoid arthritis)   . Sjoegren syndrome   . GERD (gastroesophageal reflux disease)     Past Surgical History  Procedure Date  . Tonsillectomy   . Dilation and curettage of uterus     History  Smoking status  . Never Smoker   Smokeless tobacco  . Not on file    History  Alcohol Use No    Family History  Problem Relation Age of Onset  . Heart attack      grandmother/Grandfather  . Hypertension Father   . Arrhythmia Father   . Stroke      grandparent    Reviw of Systems:  Reviewed in the HPI.  All other systems are negative.  Physical Exam: BP 128/79  Pulse 65  Ht 5\' 8"  (1.727 m)  Wt 123 lb 9.6 oz (56.065 kg)  BMI 18.79 kg/m2 The  patient is alert and oriented x 3.  The mood and affect are normal.   Skin: warm and dry.  Color is normal.    HEENT:   the sclera are nonicteric.  The mucous membranes are moist.  The carotids are 2+ without bruits.  There is no thyromegaly.  There is no JVD.    Lungs: clear.  The chest wall is non tender.    Heart: regular rate with a normal S1 and S2.  There are no murmurs, gallops, or rubs. The PMI is not displaced.     Abdomin: good bowel sounds.  There is no guarding or rebound.  There is no hepatosplenomegaly or tenderness.  There are no masses.   Extremities:  no clubbing, cyanosis, or edema.  The legs are without rashes.  The distal pulses are intact.   Neuro:  Cranial nerves II - XII are intact.  Motor and sensory functions are intact.    The gait is normal.  ECG: Normal sinus rhythm. She has no ST or T wave changes.  Assessment /  Plan:

## 2011-02-25 ENCOUNTER — Encounter: Payer: Self-pay | Admitting: Cardiovascular Disease

## 2011-02-28 DIAGNOSIS — G4710 Hypersomnia, unspecified: Secondary | ICD-10-CM

## 2011-02-28 DIAGNOSIS — G4730 Sleep apnea, unspecified: Secondary | ICD-10-CM

## 2011-03-01 NOTE — Procedures (Signed)
Marissa Underwood, Marissa Underwood              ACCOUNT NO.:  [id number]  MEDICAL RECORD NO.:  [id number]          PATIENT TYPE:  OUT  LOCATION:  SLEEP CENTER                 FACILITY:  [facility]  PHYSICIAN:  Barbaraann Share, MD,FCCPDATE OF BIRTH:  Dec 21, 1960  DATE OF STUDY:  02/24/2011                           NOCTURNAL POLYSOMNOGRAM  REFERRING PHYSICIAN:  Vesta Mixer, M.D.  INDICATION FOR STUDY:  Hypersomnia with sleep apnea.  EPWORTH SLEEPINESS SCORE:  12.  MEDICATIONS:  SLEEP ARCHITECTURE:  The patient had total sleep time of 316 minutes with no slow wave sleep and only 30 minutes of REM.  Sleep onset latency was normal at 6 minutes, and REM onset was prolonged at 159 minutes. Sleep efficiency was mildly reduced at 82%.  RESPIRATORY DATA:  The patient was found to have no apneas and only 2 obstructive hypopneas seen entire night.  This gave her an apnea/hypopnea index of only 0.4 events per hour.  There was moderate intermittent snoring noted.  The patient did not meet split-night protocol secondary to her small numbers of events.  OXYGEN DATA:  Oxygen data:  There was transient O2 desaturation as low as 93%.  CARDIAC DATA:  Rare PAC noted, but no clinically significant arrhythmias were seen.  MOVEMENT-PARASOMNIA:  The patient had no significant leg jerks or other abnormal behavior seen.  IMPRESSIONS-RECOMMENDATIONS: 1. Small numbers of obstructive events, which do not meet the AHI     criteria for the obstructive sleep apnea syndrome.  The patient did     have moderate intermittent snoring noted.  She did not meet split-     night protocol secondary to the small numbers of obstructive     events. 2. Rare PAC noted, but no clinically significant arrhythmias were     seen.    Barbaraann Share, MD,FCCP Diplomate, American Board of Sleep Medicine Electronically Signed   [facility]  D:  02/28/2011 17:05:06  T:  03/01/2011 03:22:29  Job:  [id number]

## 2011-03-16 ENCOUNTER — Encounter: Payer: Self-pay | Admitting: Family Medicine

## 2011-03-16 ENCOUNTER — Ambulatory Visit (INDEPENDENT_AMBULATORY_CARE_PROVIDER_SITE_OTHER): Payer: BC Managed Care – PPO | Admitting: Family Medicine

## 2011-03-16 VITALS — BP 112/76 | HR 59 | Ht 68.0 in | Wt 122.0 lb

## 2011-03-16 DIAGNOSIS — M25529 Pain in unspecified elbow: Secondary | ICD-10-CM

## 2011-03-16 DIAGNOSIS — K9041 Non-celiac gluten sensitivity: Secondary | ICD-10-CM

## 2011-03-16 DIAGNOSIS — K900 Celiac disease: Secondary | ICD-10-CM

## 2011-03-16 DIAGNOSIS — Z0000 Encounter for general adult medical examination without abnormal findings: Secondary | ICD-10-CM

## 2011-03-16 LAB — LIPID PANEL
Cholesterol: 167 mg/dL (ref 0–200)
Triglycerides: 50 mg/dL (ref <150)
VLDL: 10 mg/dL (ref 0–40)

## 2011-03-16 LAB — CBC
Hemoglobin: 14.1 g/dL (ref 12.0–15.0)
MCH: 29.9 pg (ref 26.0–34.0)
MCHC: 31.3 g/dL (ref 30.0–36.0)

## 2011-03-16 LAB — VITAMIN B12: Vitamin B-12: 1136 pg/mL — ABNORMAL HIGH (ref 211–911)

## 2011-03-16 NOTE — Progress Notes (Signed)
Subjective:     Marissa Underwood is a 50 y.o. female and is here for a comprehensive physical exam. The patient reports problems - yawning. See Dr. Cleotis Nipper previous notes. She is still awaiting her sleep study. also pain on the right inner elbow. for several months.she has been swimming and thinks this may have exacerbated her pain but has been working with someone on her technique to improve this.  No weakness.  Just not resolving. She would really like to start PT. Not taking meds for it. She prefers to see [facility].  Periods have been more light but more irregular over the last 3-4 months. She is not sleeping as well.  No problems falling asleep but problems staying asleep.  Says she will occ take benadryl and that does help.    History   Social History  . Marital Status: Married    Spouse Name: N/A    Number of Children: N/A  . Years of Education: N/A   Occupational History  . Not on file.   Social History Main Topics  . Smoking status: Never Smoker   . Smokeless tobacco: Not on file  . Alcohol Use: No  . Drug Use: No  . Sexually Active:      yoga instructor, self-employed, married, 2 kids, regularly exercises.   Other Topics Concern  . Not on file   Social History Narrative   Exercises regularly.  Caffeine 1  A day.    Health Maintenance  Topic Date Due  . Pap Smear  12/26/1977  . Influenza Vaccine  05/11/2011  . Mammogram  09/25/2012  . Colonoscopy  09/13/2013  . Tetanus/tdap  03/31/2014    The following portions of the patient's history were reviewed and updated as appropriate: allergies, current medications, past family history, past social history and past surgical history.  Review of Systems A comprehensive review of systems was negative.   Objective:    BP 112/76  Pulse 59  Ht 5\' 8"  (1.727 m)  Wt 122 lb (55.339 kg)  BMI 18.55 kg/m2 General appearance: alert, cooperative and appears stated age Head: Normocephalic, without obvious abnormality,  atraumatic Eyes: conjunctiva clear, EOMi, PEERLA Ears: normal TM's and external ear canals both ears Nose: Nares normal. Septum midline. Mucosa normal. No drainage or sinus tenderness. Throat: lips, mucosa, and tongue normal; teeth and gums normal Neck: no adenopathy, no carotid bruit, supple, symmetrical, trachea midline and thyroid not enlarged, symmetric, no tenderness/mass/nodules Back: symmetric, no curvature. ROM normal. No CVA tenderness. Lungs: clear to auscultation bilaterally Breasts: normal appearance, no masses or tenderness Heart: regular rate and rhythm, S1, S2 normal, no murmur, click, rub or gallop Abdomen: soft, non-tender; bowel sounds normal; no masses,  no organomegaly Extremities: extremities normal, atraumatic, no cyanosis or edema Pulses: 2+ and symmetric Skin: Skin color, texture, turgor normal. No rashes or lesions Lymph nodes: Cervical, supraclavicular, and axillary nodes normal. Neurologic: Alert and oriented X 3, normal strength and tone. Normal symmetric reflexes. Normal coordination and gait    Assessment:    Healthy female exam.      Plan:     See After Visit Summary for Counseling Recommendations  Start a regular exercise program and make sure you are eating a healthy diet Try to eat 4 servings of dairy a day or take a calcium supplement (500mg  twice a day). Your vaccines are up to date.  Insomnia- discussed trial of valerian root or melatonin. If not helping then will change consider trial of sleep med  depending on her sleep study results. She had this done at [facility] about thee wk ago for repeated yawning episode which seems to have improved greatly.  Thougt still yawns more than usual.  Cards decided not to do further workup.   Not due for pap until Sept.

## 2011-03-16 NOTE — Patient Instructions (Signed)
Start a regular exercise program and make sure you are eating a healthy diet Try to eat 4 servings of dairy a day or take a calcium supplement (500mg twice a day). Your vaccines are up to date.   

## 2011-03-17 ENCOUNTER — Telehealth: Payer: Self-pay | Admitting: Family Medicine

## 2011-03-17 LAB — COMPLETE METABOLIC PANEL WITH GFR
AST: 22 U/L (ref 0–37)
Alkaline Phosphatase: 62 U/L (ref 39–117)
BUN: 12 mg/dL (ref 6–23)
Calcium: 9.1 mg/dL (ref 8.4–10.5)
Chloride: 105 mEq/L (ref 96–112)
Creat: 0.72 mg/dL (ref 0.50–1.10)

## 2011-03-17 NOTE — Telephone Encounter (Signed)
Pt.notified

## 2011-03-17 NOTE — Telephone Encounter (Signed)
Call patient: Vitamin B12 and thyroid are normal. Complete metabolic panel is normal. Cholesterol looks fantastic. No anemia on her complete blood count.

## 2011-03-31 ENCOUNTER — Telehealth: Payer: Self-pay | Admitting: Cardiovascular Disease

## 2011-03-31 NOTE — Telephone Encounter (Signed)
Please call patient back regarding test results.

## 2011-04-01 ENCOUNTER — Telehealth: Payer: Self-pay | Admitting: *Deleted

## 2011-04-01 NOTE — Telephone Encounter (Signed)
Patient called with sleep lab results. Pt verbalized understanding. Alfonso Ramus RN

## 2011-04-24 ENCOUNTER — Ambulatory Visit: Payer: BC Managed Care – PPO | Admitting: Cardiovascular Disease

## 2011-06-03 ENCOUNTER — Encounter: Payer: Self-pay | Admitting: Family Medicine

## 2011-06-03 ENCOUNTER — Ambulatory Visit (INDEPENDENT_AMBULATORY_CARE_PROVIDER_SITE_OTHER): Payer: BC Managed Care – PPO | Admitting: Family Medicine

## 2011-06-03 VITALS — BP 112/72 | HR 63 | Temp 97.4°F | Ht 66.0 in | Wt 125.0 lb

## 2011-06-03 DIAGNOSIS — N898 Other specified noninflammatory disorders of vagina: Secondary | ICD-10-CM

## 2011-06-03 DIAGNOSIS — L293 Anogenital pruritus, unspecified: Secondary | ICD-10-CM

## 2011-06-03 LAB — WET PREP FOR TRICH, YEAST, CLUE: Clue Cells Wet Prep HPF POC: NONE SEEN

## 2011-06-03 MED ORDER — FLUCONAZOLE 150 MG PO TABS: 150.0000 mg | ORAL_TABLET | Freq: Once | ORAL | Status: AC

## 2011-06-03 NOTE — Progress Notes (Signed)
  Subjective:    Patient ID: Marissa Underwood, female    DOB: 1961/05/28, 50 y.o.   MRN: [id number]  HPI  Vaginal itching for 2 week.  Heavier d/c. No change in odor, more white. Tried the monistat 3 and it helps and then when stopped sxs have recurred.  Lst tx was Sat (about 4 days ago). No bleeding rash.  Review of Systems     Objective:   Physical Exam        Assessment & Plan:  Vaginitis - will tx with diflucan and send a wet prep. We will call with results when ready.

## 2011-06-04 ENCOUNTER — Telehealth: Payer: Self-pay | Admitting: *Deleted

## 2011-06-04 NOTE — Telephone Encounter (Signed)
Message copied by Wyline Beady on Thu Jun 04, 2011  9:39 AM ------      Message from: [name] D      Created: Thu Jun 04, 2011  6:32 AM       Her wet prep was completely normal. I still encourage her to take the Diflucan to see if this does improve her symptoms. It could be that the fungal, is very low because she had used an over-the-counter treatment. If she is still having itching at the Diflucan and please let me know.

## 2011-06-04 NOTE — Telephone Encounter (Signed)
Pt.notified

## 2011-06-26 ENCOUNTER — Encounter: Payer: Self-pay | Admitting: *Deleted

## 2011-06-26 ENCOUNTER — Emergency Department (INDEPENDENT_AMBULATORY_CARE_PROVIDER_SITE_OTHER)
Admission: EM | Admit: 2011-06-26 | Discharge: 2011-06-26 | Disposition: A | Discharge status: Home / Self Care | Payer: BC Managed Care – PPO | Source: Home / Self Care | Attending: Family Medicine | Admitting: Family Medicine

## 2011-06-26 ENCOUNTER — Other Ambulatory Visit: Payer: Self-pay | Admitting: Family Medicine

## 2011-06-26 DIAGNOSIS — IMO0001 Reserved for inherently not codable concepts without codable children: Secondary | ICD-10-CM

## 2011-06-26 DIAGNOSIS — L03019 Cellulitis of unspecified finger: Secondary | ICD-10-CM

## 2011-06-26 MED ORDER — CEPHALEXIN 500 MG PO CAPS: 500.0000 mg | ORAL_CAPSULE | Freq: Three times a day (TID) | ORAL | Status: AC

## 2011-06-26 NOTE — ED Notes (Addendum)
Patient c/o left 4th finger pain x 2 weeks, throbbing. Today she was able to squeeze pus from it.

## 2011-06-29 NOTE — ED Provider Notes (Signed)
History     CSN: [id number] Arrival date & time: 06/26/2011  8:58 AM   First MD Initiated Contact with Patient 06/26/11 [phone]      Chief Complaint  Patient presents with  . Wound Infection    right 4th finger     HPI Comments: Patient complains of two week history of pain in her left 4th fingertip at edge of nail.  There has been a small amount of purulent drainage.  The history is provided by the patient.    Past Medical History  Diagnosis Date  . Granuloma annulare     history of  . Tachycardia   . RA (rheumatoid arthritis)     took Methotrexate previously  . Sjoegren syndrome   . GERD (gastroesophageal reflux disease)   . Miscarriage     x 2   . Foot fracture     Past Surgical History  Procedure Date  . Tonsillectomy   . Dilation and curettage of uterus      x 2.      Family History  Problem Relation Age of Onset  . Heart attack      grandmother/Grandfather  . Hypertension Father   . Arrhythmia Father   . Stroke      grandparent    History  Substance Use Topics  . Smoking status: Never Smoker   . Smokeless tobacco: Not on file  . Alcohol Use: No    OB History    Grav Para Term Preterm Abortions TAB SAB Ect Mult Living                  Review of Systems  Constitutional: Negative.   HENT: Negative.   Eyes: Negative.   Respiratory: Negative.   Cardiovascular: Negative.   Gastrointestinal: Negative.   Genitourinary: Negative.   Musculoskeletal: Negative.   Skin:       Pain and swelling left 4th fingertip    Allergies  Naproxen  Home Medications   Current Outpatient Rx  Name Route Sig Dispense Refill  . PRELIEF PO Oral Take 2 tablets by mouth daily.      Marland Kitchen CALCIUM-VITAMIN D 250-125 MG-UNIT PO TABS Oral Take 1 tablet by mouth daily.      . CEPHALEXIN 500 MG PO CAPS Oral Take 1 capsule (500 mg total) by mouth 3 (three) times daily. 30 capsule 0  . CYCLOSPORINE 0.05 % OP EMUL  1 drop every 12 (twelve) hours.      . ESTRADIOL 0.1 MG/GM  VA CREA Vaginal Place 2 g vaginally daily.      . OMEGA-3 FATTY ACIDS 1000 MG PO CAPS Oral Take 1 g by mouth daily.      . ITRACONAZOLE PO Oral Take 15 mg by mouth daily.      Marland Kitchen MAGNESIUM OXIDE 200 MG PO TABS Oral Take by mouth.      . ALIGN PO Oral Take by mouth.      Marland Kitchen RANITIDINE HCL 150 MG PO TABS  TAKE 1 TABLET BY MOUTH TWO TIMES A DAY 60 tablet 4    BP 122/75  Pulse 75  Temp(Src) 98 F (36.7 C) (Oral)  Resp 14  Ht 5\' 8"  (1.727 m)  Wt 125 lb (56.7 kg)  BMI 19.01 kg/m2  SpO2 100%  LMP 06/15/2011  Physical Exam  Nursing note and vitals reviewed. Constitutional: She appears well-developed and well-nourished. No distress.  HENT:  Head: Normocephalic.  Mouth/Throat: Oropharynx is clear and moist.  Eyes: Conjunctivae are normal.  Pupils are equal, round, and reactive to light.  Musculoskeletal:       Hands:      Left 4th fingertip on radial aspect of fingernail, there is mild swelling, erythema, and tenderness.  There is a small amount of dried discharge.  Distal neurovascular function is intact.     ED Course  Procedures:  None   Labs Reviewed  WOUND CULTURE (INCLUDES ANAEROBIC) pending   Narrative:    Performed at:  [facility]                [address]                [place], [place] [address]      1. Paronychia of fourth finger, left       MDM  Wound culture taken.  Begin Keflex.  Begin warm soaks several times daily. follow-up with PCP if not improving.        Donna Christen, MD 06/29/11 [phone]

## 2011-06-30 LAB — WOUND CULTURE: Gram Stain: NONE SEEN

## 2011-10-09 ENCOUNTER — Other Ambulatory Visit: Payer: Self-pay | Admitting: Family Medicine

## 2011-10-09 DIAGNOSIS — Z1231 Encounter for screening mammogram for malignant neoplasm of breast: Secondary | ICD-10-CM

## 2011-11-04 ENCOUNTER — Ambulatory Visit (HOSPITAL_COMMUNITY)
Admission: RE | Admit: 2011-11-04 | Discharge: 2011-11-04 | Disposition: A | Discharge status: Home / Self Care | Payer: BC Managed Care – PPO | Source: Ambulatory Visit | Attending: Family Medicine | Admitting: Family Medicine

## 2011-11-04 DIAGNOSIS — Z1231 Encounter for screening mammogram for malignant neoplasm of breast: Secondary | ICD-10-CM

## 2011-11-23 ENCOUNTER — Encounter: Payer: Self-pay | Admitting: Family Medicine

## 2011-11-23 ENCOUNTER — Ambulatory Visit (INDEPENDENT_AMBULATORY_CARE_PROVIDER_SITE_OTHER): Payer: BC Managed Care – PPO | Admitting: Family Medicine

## 2011-11-23 VITALS — BP 124/72 | HR 74 | Ht 66.0 in | Wt 123.0 lb

## 2011-11-23 DIAGNOSIS — R51 Headache: Secondary | ICD-10-CM

## 2011-11-23 DIAGNOSIS — H04129 Dry eye syndrome of unspecified lacrimal gland: Secondary | ICD-10-CM

## 2011-11-23 DIAGNOSIS — J329 Chronic sinusitis, unspecified: Secondary | ICD-10-CM

## 2011-11-23 DIAGNOSIS — H9319 Tinnitus, unspecified ear: Secondary | ICD-10-CM

## 2011-11-23 DIAGNOSIS — H9190 Unspecified hearing loss, unspecified ear: Secondary | ICD-10-CM

## 2011-11-23 MED ORDER — FLUTICASONE PROPIONATE 50 MCG/ACT NA SUSP: 2.0000 | Freq: Every day | NASAL | Status: DC

## 2011-11-23 MED ORDER — AMOXICILLIN-POT CLAVULANATE 875-125 MG PO TABS: 1.0000 | ORAL_TABLET | Freq: Two times a day (BID) | ORAL | Status: AC

## 2011-11-23 NOTE — Progress Notes (Signed)
Subjective:    Patient ID: Marissa Underwood, female    DOB: 1960-09-30, 51 y.o.   MRN: [id number]  HPI Had a shooting pain on the left side of her head about 4 years ago.  Started on the left side again this time.Scalp is not tender.   Says her left eye watered and was itching and then about a week later and had a severe HA that lasted for about 3 days, shooting pain (8-9/10). It started to ease off and had tightness in the back of her head.  Has had pain since then. Recurrence start a couple of weeks ago.  Will have brief shooting pain that can happen up to 3 x in an our. Says worse in the Am.  Says Still not sleeping well.  Noticed her right nostril is very congested.  Hx of sgorens and RA. Tried using the netti-pot but has had blood in the rinse. No noosebleds.  Says her chronic tinnitis is worse and now has pressure sensation in both ears.  Bilateral jaw pain. A lot of pressure on the left eye and left upper jaw when leans forward.  Says maybetter with her yoga.  No hx of seasonal allergies. Using humidifier.  Using nasal gel as well.  + dry eye syndrome.   Review of Systems  Patient Active Problem List  Diagnoses  . GERD  . SJOGREN'S SYNDROME  . RHEUMATOID ARTHRITIS  . VAGINITIS, ATROPHIC  . Dry eye syndrome       Objective:   Physical Exam  Constitutional: She is oriented to person, place, and time. She appears well-developed and well-nourished.  HENT:  Head: Normocephalic and atraumatic.  Right Ear: External ear normal.  Left Ear: External ear normal.  Nose: Nose normal.  Mouth/Throat: Oropharynx is clear and moist.       TMs and canals are clear.   Eyes: Conjunctivae and EOM are normal. Pupils are equal, round, and reactive to light.       No eye discharge.   Neck: Neck supple. No thyromegaly present.  Cardiovascular: Normal rate, regular rhythm and normal heart sounds.   Pulmonary/Chest: Effort normal and breath sounds normal. She has no wheezes.  Lymphadenopathy:    She  has no cervical adenopathy.  Neurological: She is alert and oriented to person, place, and time.  Skin: Skin is warm and dry.       No rash on the scalpt.   Psychiatric: She has a normal mood and affect.          Assessment & Plan:  Suspect sinusitis-based on her symptoms of facial pressure especially that left side radiating into her jaw I really think she probably has a sinusitis. At this point, we'll treat her for acute sinusitis with Augmentin twice a day for 14 days. If she's not significantly better in about 10 days simply call the office. If she's progressing nicely but not completely better then we could consider calling in an additional one to 2 weeks of antibiotics for possible chronic sinusitis. If her facial symptoms of pressure or pain and discomfort resolved but she continues to have a sharp shooting headaches and please let me know as well as we may need to consider working this up separately if it does not resolve. She can continue to 90, but I did discuss with her making sure that she boil the water first and let it cool to the right temperature, instead using tap Water. Can use an Tylenol prn for pain relief  if needed.   HA  -at the bases likely related to her sinuses. We will treat for acute sinusitis and if she does not improve then consider further evaluation.  Chronic tennitis-she would like to have repeat hearing testing. The last time she had hearing testing was about 10 years ago she did have a slight dip in one of her hearing frequencies. We will refer her for audiometry.

## 2011-11-23 NOTE — Patient Instructions (Signed)
Call if not better in 10 days.   

## 2012-02-08 ENCOUNTER — Encounter: Payer: Self-pay | Admitting: Family Medicine

## 2012-02-08 ENCOUNTER — Ambulatory Visit (INDEPENDENT_AMBULATORY_CARE_PROVIDER_SITE_OTHER): Payer: BC Managed Care – PPO | Admitting: Family Medicine

## 2012-02-08 VITALS — BP 115/72 | HR 68 | Ht 66.0 in | Wt 120.0 lb

## 2012-02-08 DIAGNOSIS — R109 Unspecified abdominal pain: Secondary | ICD-10-CM

## 2012-02-08 DIAGNOSIS — R1013 Epigastric pain: Secondary | ICD-10-CM

## 2012-02-08 MED ORDER — SUCRALFATE 1 GM/10ML PO SUSP: 1.0000 g | Freq: Every day | ORAL | Status: DC

## 2012-02-08 NOTE — Progress Notes (Signed)
Subjective:    Patient ID: Marissa Underwood, female    DOB: August 23, 1960, 51 y.o.   MRN: [id number]  HPI Has had abdominal pain on and off for years. This time started about 2.5 months ago. Says can happen at night and wake her up.  Does have pain in epigastrum. Says can sometimes feel better when takes a deep breath.  Says sometime drinking a glass of waterh helps.  Says went back to the prevacid and made it feel like a constant pain for about 10 days.  Hx of GERD.  Had normal edoscopy about 2 years ago and was normal.  Normal colonosocpy in 2005.  Says radiating into her back.  No blood in the stool.    Review of Systems  BP 115/72  Pulse 68  Ht 5\' 6"  (1.676 m)  Wt 120 lb (54.432 kg)  BMI 19.37 kg/m2  SpO2 100%    Allergies  Allergen Reactions  . Naproxen     Past Medical History  Diagnosis Date  . Granuloma annulare     history of  . Tachycardia   . RA (rheumatoid arthritis)     took Methotrexate previously  . Sjoegren syndrome   . GERD (gastroesophageal reflux disease)   . Miscarriage     x 2   . Foot fracture     Past Surgical History  Procedure Date  . Tonsillectomy   . Dilation and curettage of uterus      x 2.      History   Social History  . Marital Status: Married    Spouse Name: N/A    Number of Children: N/A  . Years of Education: N/A   Occupational History  . Not on file.   Social History Main Topics  . Smoking status: Never Smoker   . Smokeless tobacco: Not on file  . Alcohol Use: No  . Drug Use: No  . Sexually Active:      yoga instructor, self-employed, married, 2 kids, regularly exercises.   Other Topics Concern  . Not on file   Social History Narrative   Exercises regularly.  Caffeine 1  A day.     Family History  Problem Relation Age of Onset  . Heart attack      grandmother/Grandfather  . Hypertension Father   . Arrhythmia Father   . Stroke      grandparent    Outpatient Encounter Prescriptions as of 02/08/2012    Medication Sig Dispense Refill  . estradiol (ESTRACE) 0.1 MG/GM vaginal cream Place 2 g vaginally daily.        . Calcium-Magnesium (CAL-MAG PO) Take 450-500 mg by mouth.      . cholecalciferol (VITAMIN D) 400 UNITS TABS Take by mouth.      . cycloSPORINE (RESTASIS) 0.05 % ophthalmic emulsion 1 drop every 12 (twelve) hours.        . Evening Primrose topical oil Apply topically as needed.      . fluticasone (FLONASE) 50 MCG/ACT nasal spray Place 2 sprays into the nose daily.  16 g  12  . ITRACONAZOLE PO Take 15 mg by mouth daily.       Marland Kitchen loratadine (CLARITIN) 10 MG tablet Take 10 mg by mouth daily.      . Probiotic Product (ALIGN PO) Take by mouth.        . ranitidine (ZANTAC) 75 MG tablet Take 75 mg by mouth 2 (two) times daily.      . sucralfate (CARAFATE)  1 GM/10ML suspension Take 10 mLs (1 g total) by mouth at bedtime.  420 mL  0          Objective:   Physical Exam  Constitutional: She is oriented to person, place, and time. She appears well-developed and well-nourished.  HENT:  Head: Normocephalic and atraumatic.  Cardiovascular: Normal rate, regular rhythm and normal heart sounds.   Pulmonary/Chest: Effort normal and breath sounds normal.  Abdominal: Soft. Bowel sounds are normal. She exhibits no distension and no mass. There is tenderness. There is no rebound and no guarding.       Tender over the epigastrium with deep palpation.  Neurological: She is alert and oriented to person, place, and time.  Skin: Skin is warm and dry.  Psychiatric: She has a normal mood and affect. Her behavior is normal.          Assessment & Plan:  Epigastric pain - most likely route caused by GERD or gastritis or possibly even a gastric ulcer. We discussed options. She really wants to stay away from a PPI. In fact she tried 2 different ones and it made her symptoms worse over the last couple of weeks. We discussed trial of Carafate just at bedtime since her symptoms are predominantly only at  night. She or he sleeps with the head of the bed elevated. She can continue the ranitidine for now. She says she had already increased the dose 250 mg twice a day. Recommend GB US to rule out cholecystitis..  Check pancreas enzymes and liver enzymes. Consider further evaluation with barium swallow study if needed or repeat endoscopy.

## 2012-02-09 ENCOUNTER — Other Ambulatory Visit: Payer: Self-pay | Admitting: Family Medicine

## 2012-02-09 LAB — CBC WITH DIFFERENTIAL/PLATELET
Basophils Absolute: 0 10*3/uL (ref 0.0–0.1)
Eosinophils Relative: 1 % (ref 0–5)
HCT: 43.9 % (ref 36.0–46.0)
Hemoglobin: 14.8 g/dL (ref 12.0–15.0)
Lymphocytes Relative: 26 % (ref 12–46)
Lymphs Abs: 1.8 10*3/uL (ref 0.7–4.0)
MCV: 90.5 fL (ref 78.0–100.0)
Monocytes Absolute: 0.6 10*3/uL (ref 0.1–1.0)
Monocytes Relative: 9 % (ref 3–12)
Neutro Abs: 4.5 10*3/uL (ref 1.7–7.7)
WBC: 7 10*3/uL (ref 4.0–10.5)

## 2012-02-10 LAB — COMPLETE METABOLIC PANEL WITH GFR
ALT: 12 U/L (ref 0–35)
Albumin: 4.5 g/dL (ref 3.5–5.2)
Alkaline Phosphatase: 68 U/L (ref 39–117)
CO2: 27 mEq/L (ref 19–32)
Glucose, Bld: 91 mg/dL (ref 70–99)
Potassium: 4.8 mEq/L (ref 3.5–5.3)
Sodium: 138 mEq/L (ref 135–145)
Total Bilirubin: 0.4 mg/dL (ref 0.3–1.2)
Total Protein: 7 g/dL (ref 6.0–8.3)

## 2012-02-10 LAB — GAMMA GT: GGT: 11 U/L (ref 7–51)

## 2012-02-19 ENCOUNTER — Ambulatory Visit (HOSPITAL_BASED_OUTPATIENT_CLINIC_OR_DEPARTMENT_OTHER)
Admission: RE | Admit: 2012-02-19 | Discharge: 2012-02-19 | Disposition: A | Discharge status: Home / Self Care | Payer: BC Managed Care – PPO | Source: Ambulatory Visit | Attending: Family Medicine | Admitting: Family Medicine

## 2012-02-19 DIAGNOSIS — R1013 Epigastric pain: Secondary | ICD-10-CM | POA: Insufficient documentation

## 2012-02-21 ENCOUNTER — Other Ambulatory Visit: Payer: Self-pay | Admitting: Family Medicine

## 2012-02-21 DIAGNOSIS — R1013 Epigastric pain: Secondary | ICD-10-CM

## 2012-03-16 ENCOUNTER — Encounter: Payer: Self-pay | Admitting: Family Medicine

## 2012-03-16 ENCOUNTER — Ambulatory Visit (INDEPENDENT_AMBULATORY_CARE_PROVIDER_SITE_OTHER): Payer: BC Managed Care – PPO | Admitting: Family Medicine

## 2012-03-16 VITALS — BP 118/75 | HR 69 | Ht 66.0 in | Wt 118.0 lb

## 2012-03-16 DIAGNOSIS — L738 Other specified follicular disorders: Secondary | ICD-10-CM

## 2012-03-16 DIAGNOSIS — L678 Other hair color and hair shaft abnormalities: Secondary | ICD-10-CM

## 2012-03-16 DIAGNOSIS — L739 Follicular disorder, unspecified: Secondary | ICD-10-CM

## 2012-03-16 MED ORDER — MUPIROCIN 2 % EX OINT: TOPICAL_OINTMENT | Freq: Two times a day (BID) | CUTANEOUS | Status: DC

## 2012-03-16 NOTE — Progress Notes (Signed)
  Subjective:    Patient ID: Marissa Underwood, female    DOB: [birth year], 51 y.o.   MRN: [id number]  HPI She has a bump on her right axilla. Has been there for several days. She wanted to make sure it was not a lymph node. She thinks it may be from shaving but she's not sure. It's a little tender. No actual drainage. No redness. No fevers. She does shave regularly and wears the interim.   Review of Systems     Objective:   Physical Exam  Constitutional: She appears well-developed and well-nourished.  HENT:  Head: Normocephalic and atraumatic.  Skin: Skin is warm and dry.       She is to swollen follicles in her right axilla. Each is approximately half a centimeter in size. No significant erythema. She does have a large tear follicle at the center of each. No active drainage. No streaking or erythema. Mildly tender to palpation.  Psychiatric: She has a normal mood and affect. Her behavior is normal.          Assessment & Plan:  Folliculitis-at this point there is no abscess to drain. Discussed avoiding deodorant for the next couple of days. Making sure that she's cleaning her razor in between shaved with alcohol. Use an antibacterial soap in the underarms for least the next week. I called in a prescription for Bactroban ointment to apply twice a day. Has not significantly better by Monday, in 5 days, then please call the office. Also call sooner if she notices any redness, streaking or swelling that might require I&D.

## 2012-03-16 NOTE — Patient Instructions (Addendum)
Warm compresses to the area.  Saok razor in alcohol between shaving.   Avoid deodorant for a few days Apply the ointment daily

## 2012-03-24 ENCOUNTER — Encounter: Payer: Self-pay | Admitting: Family Medicine

## 2012-03-24 ENCOUNTER — Ambulatory Visit (INDEPENDENT_AMBULATORY_CARE_PROVIDER_SITE_OTHER): Payer: BC Managed Care – PPO | Admitting: Family Medicine

## 2012-03-24 VITALS — BP 118/69 | HR 67 | Temp 98.0°F | Resp 16 | Wt 118.0 lb

## 2012-03-24 DIAGNOSIS — B354 Tinea corporis: Secondary | ICD-10-CM

## 2012-03-24 MED ORDER — CLOTRIMAZOLE 1 % EX CREA: TOPICAL_CREAM | Freq: Two times a day (BID) | CUTANEOUS | Status: DC

## 2012-03-24 NOTE — Progress Notes (Signed)
CC: Marissa Underwood is a 51 y.o. female is here for Rash   Subjective: HPI:  Red itchy spot left shin for the past week, itching since the worse, is not expanding. Has never had this before. No one else in house has similar rash. No interventions as of yet. Has 2 dogs and a cat at home but no trauma from these animals. Denies lymph node swelling, fevers, chills, fatigue, nausea, nor in the discharge at the site of the lesion.    Review Of Systems Outlined In HPI  Past Medical History  Diagnosis Date  . Granuloma annulare     history of  . Tachycardia   . RA (rheumatoid arthritis)     took Methotrexate previously  . Sjoegren syndrome   . GERD (gastroesophageal reflux disease)   . Miscarriage     x 2   . Foot fracture      Family History  Problem Relation Age of Onset  . Heart attack      grandmother/Grandfather  . Hypertension Father   . Arrhythmia Father   . Stroke      grandparent     History  Substance Use Topics  . Smoking status: Never Smoker   . Smokeless tobacco: Not on file  . Alcohol Use: No     Objective: Filed Vitals:   03/24/12 1537  BP: 118/69  Pulse: 67  Temp: 98 F (36.7 C)  Resp: 16    General: Alert and Oriented, No Acute Distress HEENT:  Conjunctivae clear.   Lungs:  Comfortable work of breathing. Good air movement. Extremities: No peripheral edema.  Left shin has 1.5cm lesion with raised erythemaous periphery and central clearing. Skin: Warm and dry.  Assessment & Plan: Marissa Underwood was seen today for rash.  Diagnoses and associated orders for this visit:  Tinea corporis - clotrimazole (LOTRIMIN) 1 % cream; Apply topically 2 (two) times daily. Apply to lesion twice a day for one week after symptoms abate.  Suspect tinea infection most likely from pets and/or in the gym, regardless use topical antifungal. If not improved in one week return for reevaluation or call for new symptoms.    Return if symptoms worsen or fail to  improve.  Requested Prescriptions   Signed Prescriptions Disp Refills  . clotrimazole (LOTRIMIN) 1 % cream 30 g 1    Sig: Apply topically 2 (two) times daily. Apply to lesion twice a day for one week after symptoms abate.

## 2012-08-09 ENCOUNTER — Other Ambulatory Visit: Payer: Self-pay | Admitting: *Deleted

## 2012-08-09 MED ORDER — ESTRADIOL 0.1 MG/GM VA CREA: 2.0000 g | TOPICAL_CREAM | Freq: Every day | VAGINAL | Status: DC

## 2012-08-29 ENCOUNTER — Telehealth: Payer: Self-pay | Admitting: *Deleted

## 2012-08-29 DIAGNOSIS — Z13220 Encounter for screening for lipoid disorders: Secondary | ICD-10-CM

## 2012-08-29 DIAGNOSIS — M069 Rheumatoid arthritis, unspecified: Secondary | ICD-10-CM

## 2012-08-29 NOTE — Telephone Encounter (Signed)
OK for CMP, vit D, Lipids, CBC.  Can use RA as dx and screen lipid code.

## 2012-08-29 NOTE — Telephone Encounter (Signed)
Pt has appt with you on Wednesday and would like to get labs done before appt. Also request a Vitamin D level be drawn as well. Please advise

## 2012-08-30 ENCOUNTER — Telehealth: Payer: Self-pay | Admitting: *Deleted

## 2012-08-30 DIAGNOSIS — M069 Rheumatoid arthritis, unspecified: Secondary | ICD-10-CM

## 2012-08-30 NOTE — Telephone Encounter (Signed)
Lab entered

## 2012-08-30 NOTE — Telephone Encounter (Signed)
Pt.notified

## 2012-08-31 ENCOUNTER — Encounter: Payer: Self-pay | Admitting: Family Medicine

## 2012-08-31 ENCOUNTER — Other Ambulatory Visit (HOSPITAL_COMMUNITY)
Admission: RE | Admit: 2012-08-31 | Discharge: 2012-08-31 | Disposition: A | Discharge status: Home / Self Care | Payer: Managed Care, Other (non HMO) | Source: Ambulatory Visit | Attending: Family Medicine | Admitting: Family Medicine

## 2012-08-31 ENCOUNTER — Ambulatory Visit (INDEPENDENT_AMBULATORY_CARE_PROVIDER_SITE_OTHER): Payer: BC Managed Care – PPO | Admitting: Family Medicine

## 2012-08-31 VITALS — BP 101/58 | HR 78 | Resp 16 | Ht 68.0 in | Wt 124.0 lb

## 2012-08-31 DIAGNOSIS — IMO0002 Reserved for concepts with insufficient information to code with codable children: Secondary | ICD-10-CM

## 2012-08-31 DIAGNOSIS — Z01419 Encounter for gynecological examination (general) (routine) without abnormal findings: Secondary | ICD-10-CM

## 2012-08-31 DIAGNOSIS — Z23 Encounter for immunization: Secondary | ICD-10-CM

## 2012-08-31 DIAGNOSIS — Z124 Encounter for screening for malignant neoplasm of cervix: Secondary | ICD-10-CM

## 2012-08-31 LAB — COMPLETE METABOLIC PANEL WITH GFR
Albumin: 4.4 g/dL (ref 3.5–5.2)
Alkaline Phosphatase: 83 U/L (ref 39–117)
CO2: 31 mEq/L (ref 19–32)
Calcium: 9.8 mg/dL (ref 8.4–10.5)
Chloride: 104 mEq/L (ref 96–112)
GFR, Est Non African American: 81 mL/min
Glucose, Bld: 93 mg/dL (ref 70–99)
Potassium: 4.3 mEq/L (ref 3.5–5.3)
Sodium: 139 mEq/L (ref 135–145)
Total Protein: 6.8 g/dL (ref 6.0–8.3)

## 2012-08-31 LAB — CBC
Hemoglobin: 14.3 g/dL (ref 12.0–15.0)
MCV: 91.4 fL (ref 78.0–100.0)
Platelets: 336 10*3/uL (ref 150–400)
RBC: 4.64 MIL/uL (ref 3.87–5.11)
WBC: 5.7 10*3/uL (ref 4.0–10.5)

## 2012-08-31 LAB — LIPID PANEL: Total CHOL/HDL Ratio: 2.5 Ratio

## 2012-08-31 NOTE — Patient Instructions (Addendum)
Keep up a regular exercise program and make sure you are eating a healthy diet Try to eat 4 servings of dairy a day, or if you are lactose intolerant take a calcium with vitamin D daily.  Your vaccines are up to date.   

## 2012-08-31 NOTE — Progress Notes (Signed)
Subjective:     Marissa Underwood is a 52 y.o. female and is here for a comprehensive physical exam. The patient reports problems - painful intercourse for the last couple of years. She already uses estrace twice a week adn uses a lubricant. she does have Sjogren's which does increase her dryness.. She already has a low libido and when she is concerned that intercourse will cause discomfort and it creates even more problems. She was recently diagnosed with significant constipation and has been taking a laxative a couple of times a week. Has really helped him a difference. It has not made a difference that with her dyspareunia.  History   Social History  . Marital Status: Married    Spouse Name: N/A    Number of Children: 2  . Years of Education: N/A   Occupational History  . Teach yoga    Social History Main Topics  . Smoking status: Never Smoker   . Smokeless tobacco: Not on file  . Alcohol Use: No  . Drug Use: No  . Sexually Active:      Comment: yoga instructor, self-employed, married, 2 kids, regularly exercises.   Other Topics Concern  . Not on file   Social History Narrative   Exercises regularly.  Caffeine 1  A day.    Health Maintenance  Topic Date Due  . Influenza Vaccine  04/10/2013  . Colonoscopy  09/13/2013  . Mammogram  11/03/2013  . Pap Smear  09/01/2015  . Tetanus/tdap  05/10/2022    The following portions of the patient's history were reviewed and updated as appropriate: allergies, current medications, past family history, past medical history, past social history, past surgical history and problem list.  Review of Systems A comprehensive review of systems was negative.   Objective:    BP 101/58  Pulse 78  Resp 16  Ht 5\' 8"  (1.727 m)  Wt 124 lb (56.246 kg)  BMI 18.85 kg/m2  SpO2 98% General appearance: alert, cooperative and appears stated age Head: Normocephalic, without obvious abnormality, atraumatic Eyes: conj clear, EOMi, PEERLA Ears: normal TM's  and external ear canals both ears Nose: Nares normal. Septum midline. Mucosa normal. No drainage or sinus tenderness. Throat: lips, mucosa, and tongue normal; teeth and gums normal Neck: no adenopathy, no carotid bruit, no JVD, supple, symmetrical, trachea midline and thyroid not enlarged, symmetric, no tenderness/mass/nodules Back: symmetric, no curvature. ROM normal. No CVA tenderness. Lungs: clear to auscultation bilaterally Breasts: normal appearance, no masses or tenderness Heart: regular rate and rhythm, S1, S2 normal, no murmur, click, rub or gallop Abdomen: soft, non-tender; bowel sounds normal; no masses,  no organomegaly Pelvic: cervix normal in appearance, external genitalia normal, no adnexal masses or tenderness, no cervical motion tenderness, rectovaginal septum normal, uterus normal size, shape, and consistency and vagina normal without discharge Extremities: extremities normal, atraumatic, no cyanosis or edema Pulses: 2+ and symmetric Skin: Skin color, texture, turgor normal. No rashes or lesions Lymph nodes: Cervical, supraclavicular, and axillary nodes normal. Neurologic: Grossly normal    Assessment:    Healthy female exam.      Plan:     See After Visit Summary for Counseling Recommendations  Keep up a regular exercise program and make sure you are eating a healthy diet Try to eat 4 servings of dairy a day, or if you are lactose intolerant take a calcium with vitamin D daily.  Your vaccines are up to date.   Dyspareunia - unclear etiology at this point. She's been  using lubricants to help with moisture since she has Sjogren's. She's also been using Estrace to help with vaginal atrophy. I do not feel an enlarged uterus but certainly other issues such as fibroid could be causing discomfort or problems. We did do her Pap smear today. We'll call with results. I would like to refer her to a specialist for this. At this point she's been having palms for the last couple of  years

## 2012-09-01 LAB — VITAMIN D 25 HYDROXY (VIT D DEFICIENCY, FRACTURES): Vit D, 25-Hydroxy: 46 ng/mL (ref 30–89)

## 2012-09-01 NOTE — Telephone Encounter (Signed)
Quick Note:  All labs are normal. ______ 

## 2012-10-17 ENCOUNTER — Other Ambulatory Visit: Payer: Self-pay | Admitting: Family Medicine

## 2012-10-17 DIAGNOSIS — Z1231 Encounter for screening mammogram for malignant neoplasm of breast: Secondary | ICD-10-CM

## 2012-10-18 ENCOUNTER — Telehealth: Payer: Self-pay | Admitting: Family Medicine

## 2012-10-18 ENCOUNTER — Other Ambulatory Visit: Payer: Self-pay | Admitting: Family Medicine

## 2012-10-18 DIAGNOSIS — IMO0002 Reserved for concepts with insufficient information to code with codable children: Secondary | ICD-10-CM

## 2012-10-18 NOTE — Telephone Encounter (Signed)
Call pt: spoke with pelvic pain clinic at [facility] in [place]. They feel they can help you . Referral place.

## 2012-10-18 NOTE — Progress Notes (Signed)
Call pt: spoke with pelvic pain clinic at [facility] in [place]. They feel they can help you . Referral place.  

## 2012-10-19 NOTE — Telephone Encounter (Signed)
Called and informed pt of Dr. Shelah Lewandowsky suggestions. Pt voiced understanding and agreed.[name] [place]

## 2012-11-07 ENCOUNTER — Ambulatory Visit (HOSPITAL_COMMUNITY)
Admission: RE | Admit: 2012-11-07 | Discharge: 2012-11-07 | Disposition: A | Discharge status: Home / Self Care | Payer: Managed Care, Other (non HMO) | Source: Ambulatory Visit | Attending: Family Medicine | Admitting: Family Medicine

## 2012-11-07 DIAGNOSIS — Z1231 Encounter for screening mammogram for malignant neoplasm of breast: Secondary | ICD-10-CM

## 2012-11-18 ENCOUNTER — Telehealth: Payer: Self-pay | Admitting: *Deleted

## 2012-11-18 NOTE — Telephone Encounter (Signed)
This can be stopped without any need for taper.

## 2012-11-18 NOTE — Telephone Encounter (Signed)
Pt notified can stop med. Barry Dienes, LPN

## 2012-11-18 NOTE — Telephone Encounter (Signed)
Patient calls and wants to stop the Estrace. Wants to know can she just abruptly stop it or does she need to be weaned off? Please advise

## 2013-04-25 LAB — BASIC METABOLIC PANEL: Glucose: 100 mg/dL

## 2013-05-15 ENCOUNTER — Encounter: Payer: Self-pay | Admitting: Family Medicine

## 2013-05-15 ENCOUNTER — Ambulatory Visit (INDEPENDENT_AMBULATORY_CARE_PROVIDER_SITE_OTHER): Payer: BC Managed Care – PPO | Admitting: Family Medicine

## 2013-05-15 VITALS — BP 126/67 | HR 73 | Wt 125.0 lb

## 2013-05-15 DIAGNOSIS — R6883 Chills (without fever): Secondary | ICD-10-CM

## 2013-05-15 DIAGNOSIS — K589 Irritable bowel syndrome without diarrhea: Secondary | ICD-10-CM

## 2013-05-15 DIAGNOSIS — Q619 Cystic kidney disease, unspecified: Secondary | ICD-10-CM

## 2013-05-15 DIAGNOSIS — N281 Cyst of kidney, acquired: Secondary | ICD-10-CM

## 2013-05-15 DIAGNOSIS — K7689 Other specified diseases of liver: Secondary | ICD-10-CM

## 2013-05-15 DIAGNOSIS — IMO0002 Reserved for concepts with insufficient information to code with codable children: Secondary | ICD-10-CM

## 2013-05-15 DIAGNOSIS — Z23 Encounter for immunization: Secondary | ICD-10-CM

## 2013-05-15 DIAGNOSIS — E875 Hyperkalemia: Secondary | ICD-10-CM

## 2013-05-15 LAB — BASIC METABOLIC PANEL WITH GFR
BUN: 14 mg/dL (ref 6–23)
CO2: 30 mEq/L (ref 19–32)
Chloride: 102 mEq/L (ref 96–112)
Creat: 0.81 mg/dL (ref 0.50–1.10)
GFR, Est Non African American: 83 mL/min
Glucose, Bld: 97 mg/dL (ref 70–99)
Sodium: 137 mEq/L (ref 135–145)

## 2013-05-15 LAB — TSH: TSH: 2.74 u[IU]/mL (ref 0.350–4.500)

## 2013-05-15 MED ORDER — SUCRALFATE 1 GM/10ML PO SUSP: 1.0000 g | Freq: Every day | ORAL | Status: DC

## 2013-05-15 NOTE — Progress Notes (Signed)
Quick Note:  All labs are normal. ______ 

## 2013-05-15 NOTE — Progress Notes (Signed)
Subjective:    Patient ID: Marissa Underwood, female    DOB: Apr 21, 1961, 52 y.o.   MRN: [id number]  HPI pt was seen by her GI and was told to f/u with pcp about her labs.  [facility] He did go ahead and treat her with Augmentin for 10 days empirically for possible bacterial overgrowth syndrome since she does have a history of Sjogren's. They also diagnosed with irritable bowel syndrome. She has felt some better since then. She had a CT of the abdomen that was negative except for some presumed tiny renal and hepatic cysts. She also had a CBC and a CMP which were  normal except for an elevated potassium level. Stil having some epigastric pain at night. The sucralfate helped some. She has started ranitidine since I last saw her. The she feels it really hasn't made a big difference and epigastric discomfort.  Insomnia-she's not been sleeping well. She typically goes to bed around 11. She's not having problems falling asleep is having problems staying asleep. She typically will wake up around 4 AM and then not be able to go back to sleep. Overall she's getting about 5 hours per day. This started about a month or so ago. She was taking some Unisom and it does seem to help but was worried about taking this chronically since yard he has Sjogren's and has problems with dry mucous membranes. She was taking valerian root at one time and it was helpful but has stopped this at one time as well because she started having chills at night. She says typically she will get chills to the point that she shakes. It can last anywhere from 10 minutes to 45 minutes. She has taken her temperature several times when this happens and denies an actual fever. This has actually been going on for months.  Review of Systems     Objective:   Physical Exam  Constitutional: She is oriented to person, place, and time. She appears well-developed and well-nourished.  HENT:  Head: Normocephalic and  atraumatic.  Neurological: She is alert and oriented to person, place, and time.  Skin: Skin is warm and dry.  Psychiatric: She has a normal mood and affect. Her behavior is normal. Judgment and thought content normal.          Assessment & Plan:  Hyperkalemia-Often times this is from mild dehydration or possibly hemolysis from when the blood sample was actually taken. We'll repeat a BMP today. She is on any description medications that should cause hyperkalemia. Though she does eat a very potassium rich diet.  Liver cyst-per the report the liver cyst seen on CT of the abdomen was stable from May of 2013. I reassured her that this means that the lesion is most likely benign it has not changed in size in a most year and half. I think she would feel better if we repeat her ultrasound in a year just to make sure that there are no new changes.  Renal cyst-the cyst are 2 mm,  which are very tiny. Explained her that this is very low-risk for a suspicious renal lesion. Again we'll repeat ultrasound of the abdomen one year to make sure that there is no change.  Insomnia-consider retrying the valerian root. This would be reasonable. Can try the capsules. Not helpful can consider a prescription medication. I agree to taking products like Unisom which typically have Benadryl is the main ingredient can be excessively drying since yard he  has problems with dryness with her Sjogren's syndrome.  Chills at night-I would like to check her thyroid to make sure as she's not developing hyperthyroidism. She's not having any fevers with this is reassuring.Unclear explaination though does have a low BMI.   Epigastric pain-will refill Sudafed since it was helpful. She notices a short-term medication. Continue ranitidine as well.

## 2013-05-15 NOTE — Patient Instructions (Addendum)
Repeat US in one year (abdominal US) to check on liver cyst and kidney cysts.

## 2013-05-16 ENCOUNTER — Encounter: Payer: Self-pay | Admitting: *Deleted

## 2013-06-09 ENCOUNTER — Encounter: Payer: Self-pay | Admitting: Obstetrics & Gynecology

## 2013-06-09 ENCOUNTER — Ambulatory Visit (INDEPENDENT_AMBULATORY_CARE_PROVIDER_SITE_OTHER): Payer: Managed Care, Other (non HMO) | Admitting: Obstetrics & Gynecology

## 2013-06-09 VITALS — BP 124/70 | HR 60 | Resp 16 | Ht 68.0 in | Wt 125.2 lb

## 2013-06-09 DIAGNOSIS — Z0000 Encounter for general adult medical examination without abnormal findings: Secondary | ICD-10-CM

## 2013-06-09 DIAGNOSIS — N952 Postmenopausal atrophic vaginitis: Secondary | ICD-10-CM

## 2013-06-09 DIAGNOSIS — G4700 Insomnia, unspecified: Secondary | ICD-10-CM

## 2013-06-09 MED ORDER — TEMAZEPAM 15 MG PO CAPS: 15.0000 mg | ORAL_CAPSULE | Freq: Every evening | ORAL | Status: DC | PRN

## 2013-06-09 NOTE — Patient Instructions (Signed)

## 2013-06-09 NOTE — Progress Notes (Signed)
52 y.o. G4P2 MarriedCaucasianF here for establishing care.  Has AEX scheduled 2/15.  H/O normal pap 2/14.   No cycles in about a year.  She wants to discuss several menopausal issues-- vaginal dryness, anxiety/mood swings, insomnia, and hot flashes.  Doesn't want to be on HRT.  Doesn't really want any hormonal therapy.  Used Estrace cream for about two years.  Stopped using this and then she started using a personal lubricant without a lot of improvement.  Feels pain with insertion and therefore tightens up.  She has seen Ruben Gottron with some improvement in muscle relaxation but still has same vaginal tightness/pain/dryness issues.    Does feel as times she has an atypical emotional response to situations.  Doesn't want medication for this.  Really just wants to know this is "normal".  Discussed with pt the emotional responses that are typical with menopause.  She is comforted by this.  Sleep changes with menopause discussed.  She wakes up most nights between 1-3 and just starts rolling things around in her head.  Wants to know if there are options for treatment.  Uses Unisom but it makes her so dry.  Sleep medications discussed including side effects.  She would like to try one.    Patient's last menstrual period was 03/10/2012.          Sexually active: yes  The current method of family planning is vasectomy.    Exercising: yes  swim, walk/run, and yoga Smoker:  no  Health Maintenance: Pap:  08/31/12 with PCP History of abnormal Pap:  yes MMG:  11/07/12 normal Colonoscopy:  2005 next one scheduled 06/13/13 with endoscopy, Dr. Wynelle Beckmann in [place] BMD:   2004 TDaP:  08/2012 Screening Labs: PCP, Hb today: PCP, Urine today: negative   reports that she has never smoked. She has never used smokeless tobacco. She reports that she does not drink alcohol or use illicit drugs.  Past Medical History  Diagnosis Date  . Granuloma annulare     history of  . Tachycardia     2013, normal cardiology  work up and sleep study  . RA (rheumatoid arthritis)     took Methotrexate previously  . Sjoegren syndrome   . GERD (gastroesophageal reflux disease)   . Miscarriage     x 2   . Foot fracture     Past Surgical History  Procedure Laterality Date  . Tonsillectomy    . Dilation and curettage of uterus       x 2.      Current Outpatient Prescriptions  Medication Sig Dispense Refill  . AYR SALINE NASAL GEL Place into the nose.      Jannetta Quint SALINE NASAL NA Place into the nose.      . B Complex Vitamins (VITAMIN-B COMPLEX PO) Take by mouth daily.      . Calcium-Magnesium (CAL-MAG PO) Take 450-500 mg by mouth.      . cholecalciferol (VITAMIN D) 400 UNITS TABS Take by mouth.      . cycloSPORINE (RESTASIS) 0.05 % ophthalmic emulsion 1 drop every 12 (twelve) hours.        . Dermatological Products, Misc. [facility] EX) Apply topically.      . Digestive Enzymes (DIGEST II PO) Take by mouth.      Myriam Forehand FORMULARY [place] personal lubricant      . Omega-3 Fatty Acids (FISH OIL PO) Take by mouth daily.      . ranitidine (ZANTAC) 150 MG tablet Take  150 mg by mouth 2 (two) times daily.      . Vaginal Lubricant (REPLENS VA) Place vaginally. Every three days      . sucralfate (CARAFATE) 1 GM/10ML suspension Take 10 mLs (1 g total) by mouth at bedtime.  420 mL  0   No current facility-administered medications for this visit.    Family History  Problem Relation Age of Onset  . Heart attack Maternal Grandfather   . Hypertension Father   . Arrhythmia Father   . Stroke Maternal Grandmother     Brain anuerysm  . Heart attack Paternal Grandfather   . Heart attack Paternal Grandmother   . Heart Problems Father     on warfarin  . Glaucoma Mother     ROS:  Pertinent items are noted in HPI.  Otherwise, a comprehensive ROS was negative.  Exam:   BP 124/70  Pulse 60  Resp 16  Ht 5\' 8"  (1.727 m)  Wt 125 lb 3.2 oz (56.79 kg)  BMI 19.04 kg/m2  LMP 03/10/2012   Height: 5\' 8"  (172.7 cm)  Ht  Readings from Last 3 Encounters:  06/09/13 5\' 8"  (1.727 m)  08/31/12 5\' 8"  (1.727 m)  03/16/12 5\' 6"  (1.676 m)    General appearance: alert, cooperative and appears stated age Head: Normocephalic, without obvious abnormality, atraumatic No other exam performed  A:  PMP with vaginal dryness, insomnia, emotional lability Not interested in HRT  P:   Trial of Restoril 15mg  QHS prn.  #30/0 RF.  Pt TCB and give update Herbal remedies discussed.  Olive oil for vaginal dryness discussed.   Pt knows to call with any vaginal bleeding. RTC 2/15 for AEX.  ~30 minutes spent with patient >50% of time was in face to face discussion of above.

## 2013-06-27 ENCOUNTER — Encounter: Payer: Self-pay | Admitting: Family Medicine

## 2013-08-08 ENCOUNTER — Other Ambulatory Visit: Payer: Self-pay | Admitting: Obstetrics & Gynecology

## 2013-08-08 NOTE — Telephone Encounter (Signed)
Last Refilled: 06/09/13 #30/0 refills, patient was supposed to call back with update.  S/w patient she said the Restoril works pretty good, she uses it mainly when she travels, generally just a night or two. For example she's traveling now so she'll probably take it for the next 3 nights, to help with sleep.   Okay to refill, please advise.

## 2013-08-08 NOTE — Telephone Encounter (Signed)
Patient notified

## 2013-09-15 ENCOUNTER — Encounter: Payer: Self-pay | Admitting: Obstetrics & Gynecology

## 2013-09-29 ENCOUNTER — Ambulatory Visit: Payer: Self-pay | Admitting: Obstetrics & Gynecology

## 2013-10-02 ENCOUNTER — Encounter: Payer: Self-pay | Admitting: Obstetrics & Gynecology

## 2013-10-02 ENCOUNTER — Ambulatory Visit (INDEPENDENT_AMBULATORY_CARE_PROVIDER_SITE_OTHER): Payer: Managed Care, Other (non HMO) | Admitting: Obstetrics & Gynecology

## 2013-10-02 ENCOUNTER — Ambulatory Visit: Payer: Self-pay | Admitting: Obstetrics & Gynecology

## 2013-10-02 VITALS — BP 102/62 | HR 60 | Resp 12 | Ht 68.0 in | Wt 126.2 lb

## 2013-10-02 DIAGNOSIS — Z0000 Encounter for general adult medical examination without abnormal findings: Secondary | ICD-10-CM

## 2013-10-02 DIAGNOSIS — E2839 Other primary ovarian failure: Secondary | ICD-10-CM

## 2013-10-02 DIAGNOSIS — Z01419 Encounter for gynecological examination (general) (routine) without abnormal findings: Secondary | ICD-10-CM

## 2013-10-02 DIAGNOSIS — Z1239 Encounter for other screening for malignant neoplasm of breast: Secondary | ICD-10-CM

## 2013-10-02 DIAGNOSIS — Z124 Encounter for screening for malignant neoplasm of cervix: Secondary | ICD-10-CM

## 2013-10-02 LAB — POCT URINALYSIS DIPSTICK
Bilirubin, UA: NEGATIVE
Blood, UA: NEGATIVE
GLUCOSE UA: NEGATIVE
Ketones, UA: NEGATIVE
LEUKOCYTES UA: NEGATIVE
Nitrite, UA: NEGATIVE
Protein, UA: NEGATIVE
UROBILINOGEN UA: NEGATIVE
pH, UA: 7

## 2013-10-02 MED ORDER — TEMAZEPAM 15 MG PO CAPS: ORAL_CAPSULE | ORAL | Status: DC

## 2013-10-02 MED ORDER — ESTRADIOL 10 MCG VA TABS: ORAL_TABLET | VAGINAL | Status: DC

## 2013-10-02 MED ORDER — HYDROCORTISONE 2.5 % RE CREA: 1.0000 "application " | TOPICAL_CREAM | Freq: Two times a day (BID) | RECTAL | Status: AC

## 2013-10-02 NOTE — Addendum Note (Signed)
Addended by: Clide DalesSTEM, Amisadai Woodford R on: 10/02/2013 11:33 AM   Modules accepted: Orders

## 2013-10-02 NOTE — Patient Instructions (Signed)

## 2013-10-02 NOTE — Progress Notes (Signed)
53 y.o. G4P2 MarriedCaucasianF here for annual exam.  No vaginal bleeding.  D/W pt Pap guidelines.  She would like to have a yearly Pap.  No vaginal bleeding in a year and a half.  Having some vaginal dryness and still with painful intercourse due to menopause.    Has an epigastric "sqeezing" sensation.  Had an upper GI with colonoscopy.  Has been on Protonix without any improvement.  Pt feels it is partially positionally.    Using Restoril infrequently.  Uses with travel primarily.    Patient's last menstrual period was 03/10/2013.          Sexually active: yes  The current method of family planning is vasectomy.    Exercising: yes  swim, yoga, walk, and run Smoker:  no  Health Maintenance: Pap:  1/14-normal History of abnormal Pap:  yes MMG:  11/07/12 normal Colonoscopy:  11/14 and endoscopy-repeat in 10 years (Dr. Wynelle BeckmannGillis) BMD:   ? 2005 TDaP:  1/14 Screening Labs: 1/14, Hb today: 1/14, Urine today: PH-7.0   reports that she has never smoked. She has never used smokeless tobacco. She reports that she does not drink alcohol or use illicit drugs.  Past Medical History  Diagnosis Date  . Granuloma annulare     history of  . Tachycardia     2013, normal cardiology work up and sleep study  . RA (rheumatoid arthritis)     took Methotrexate previously  . Sjoegren syndrome   . GERD (gastroesophageal reflux disease)   . Miscarriage     x 2   . Foot fracture   . Abnormal Pap smear of cervix     Past Surgical History  Procedure Laterality Date  . Tonsillectomy    . Dilation and curettage of uterus       x 2.    . Colposcopy      Current Outpatient Prescriptions  Medication Sig Dispense Refill  . AYR SALINE NASAL GEL Place into the nose.      Jannetta Quint. AYR SALINE NASAL NA Place into the nose.      . B Complex Vitamins (VITAMIN-B COMPLEX PO) Take by mouth daily.      . Calcium Citrate-Vitamin D (CALCIUM CITRATE + PO) Take by mouth daily.      . cholecalciferol (VITAMIN D) 400 UNITS  TABS Take 1,000 Units by mouth.       . cycloSPORINE (RESTASIS) 0.05 % ophthalmic emulsion 1 drop every 12 (twelve) hours.        . Dermatological Products, Misc. [place](EPICERAM EX) Apply topically.      . Digestive Enzymes (DIGEST II PO) Take by mouth.      Marland Kitchen. MAGNESIUM CITRATE PO Take by mouth.      . Misc Natural Products (TUMERSAID PO) Take by mouth.      Myriam Forehand. NON FORMULARY [place] personal lubricant      . Omega-3 Fatty Acids (FISH OIL PO) Take by mouth daily.      . Pantoprazole Sodium (PROTONIX PO) Take by mouth. Not in last couple of weeks      . temazepam (RESTORIL) 15 MG capsule TAKE ONE CAPSULE BY MOUTH AT BEDTIME AS NEEDED SLEEP  30 capsule  0  . Vaginal Lubricant (REPLENS VA) Place vaginally. Every three days      . sucralfate (CARAFATE) 1 GM/10ML suspension Take 10 mLs (1 g total) by mouth at bedtime.  420 mL  0   No current facility-administered medications for this visit.  Family History  Problem Relation Age of Onset  . Heart attack Maternal Grandfather   . Hypertension Father   . Arrhythmia Father   . Stroke Maternal Grandmother     Brain anuerysm  . Heart attack Paternal Grandfather   . Heart attack Paternal Grandmother   . Heart Problems Father     on warfarin  . Glaucoma Mother     ROS:  Pertinent items are noted in HPI.  Otherwise, a comprehensive ROS was negative.  Exam:   BP 102/62  Pulse 60  Resp 12  Ht 5\' 8"  (1.727 m)  Wt 126 lb 3.2 oz (57.244 kg)  BMI 19.19 kg/m2  LMP 03/10/2013    Height: 5\' 8"  (172.7 cm)  Ht Readings from Last 3 Encounters:  10/02/13 5\' 8"  (1.727 m)  06/09/13 5\' 8"  (1.727 m)  08/31/12 5\' 8"  (1.727 m)    General appearance: alert, cooperative and appears stated age Head: Normocephalic, without obvious abnormality, atraumatic Neck: no adenopathy, supple, symmetrical, trachea midline and thyroid normal to inspection and palpation Lungs: clear to auscultation bilaterally Breasts: normal appearance, no masses or  tenderness Heart: regular rate and rhythm Abdomen: soft, non-tender; bowel sounds normal; no masses,  no organomegaly Extremities: extremities normal, atraumatic, no cyanosis or edema Skin: Skin color, texture, turgor normal. No rashes or lesions Lymph nodes: Cervical, supraclavicular, and axillary nodes normal. No abnormal inguinal nodes palpated Neurologic: Grossly normal   Pelvic: External genitalia:  no lesions              Urethra:  normal appearing urethra with no masses, tenderness or lesions              Bartholins and Skenes: normal                 Vagina: normal appearing vagina with normal color and discharge, no lesions              Cervix: no lesions              Pap taken: yes Bimanual Exam:  Uterus:  normal size, contour, position, consistency, mobility, non-tender              Adnexa: normal adnexa and no mass, fullness, tenderness               Rectovaginal: Confirms               Anus:  normal sphincter tone, no lesions  A:  Well Woman with normal exam PMP with vaginal dryness Sjogren's syndrome Insomnia  P:   Mammogram yearly.  D/w pt 3D MMG.   BMD with MMG.  Order placed. pap smear with HR HPV today Anusol HS 2.5% to be used BID for hemorrhoids Vagifem pv nightly for two weeks, then twice weekly restoril 15mg  Qhs prn insomnia. Labs done 1/14 with PCP. return annually or prn  An After Visit Summary was printed and given to the patient.

## 2013-10-04 LAB — IPS PAP TEST WITH HPV

## 2013-10-07 ENCOUNTER — Encounter: Payer: Self-pay | Admitting: Obstetrics & Gynecology

## 2013-10-07 ENCOUNTER — Encounter: Payer: Self-pay | Admitting: Family Medicine

## 2013-11-13 ENCOUNTER — Other Ambulatory Visit: Payer: Self-pay | Admitting: Obstetrics & Gynecology

## 2013-11-13 ENCOUNTER — Ambulatory Visit (HOSPITAL_COMMUNITY)
Admission: RE | Admit: 2013-11-13 | Discharge: 2013-11-13 | Disposition: A | Discharge status: Home / Self Care | Payer: Managed Care, Other (non HMO) | Source: Ambulatory Visit | Attending: Obstetrics & Gynecology | Admitting: Obstetrics & Gynecology

## 2013-11-13 DIAGNOSIS — E2839 Other primary ovarian failure: Secondary | ICD-10-CM

## 2013-11-13 DIAGNOSIS — Z13820 Encounter for screening for osteoporosis: Secondary | ICD-10-CM | POA: Insufficient documentation

## 2013-11-13 DIAGNOSIS — Z1239 Encounter for other screening for malignant neoplasm of breast: Secondary | ICD-10-CM

## 2013-12-18 ENCOUNTER — Encounter: Payer: Self-pay | Admitting: Family Medicine

## 2013-12-18 ENCOUNTER — Ambulatory Visit (INDEPENDENT_AMBULATORY_CARE_PROVIDER_SITE_OTHER): Payer: BC Managed Care – PPO | Admitting: Family Medicine

## 2013-12-18 VITALS — BP 100/69 | HR 90 | Ht 68.0 in | Wt 121.0 lb

## 2013-12-18 DIAGNOSIS — R1013 Epigastric pain: Secondary | ICD-10-CM

## 2013-12-18 DIAGNOSIS — M549 Dorsalgia, unspecified: Secondary | ICD-10-CM

## 2013-12-18 DIAGNOSIS — M3500 Sicca syndrome, unspecified: Secondary | ICD-10-CM

## 2013-12-18 DIAGNOSIS — G8929 Other chronic pain: Secondary | ICD-10-CM

## 2013-12-18 NOTE — Progress Notes (Signed)
   Subjective:    Patient ID: Marissa PellegriniKathryn K Underwood, female    DOB: 13-Aug-1960, 53 y.o.   MRN: [id number]  HPI She has Sjogrens and would like to discuss a new referral since her previous rheumatologist has left. Would like a referral to Rudolpho SevinJerome L Green at [facility] )   Still having epigastric pain esp at night.  Did have some gastritis of the stomach.  Tried dairy free and soy free and doesn't seem to help this pain but helps with the gas. If takes her sleeping pill then doesn' wka u p with it.  Did try a PPI but not responding  . No true dysphasia. Occasionally she feels a little bit of discomfort after eating but doesn't complain of food getting stuck.  Still having some right upper back pain between the shoulder blade and the spine.  Now having a pain near the lowe cervical spine that is more new but more intense.  She is in physical therapy in the past and this was very helpful. She would like to consider doing this again. She did use a TENS unit for a period time. No prior history of abnormality or scoliosis of the spine.   Review of Systems     Objective:   Physical Exam  Constitutional: She appears well-developed and well-nourished.  HENT:  Head: Normocephalic and atraumatic.  Pulmonary/Chest:    Abdominal: Soft. Bowel sounds are normal. She exhibits no distension. There is tenderness. There is no rebound and no guarding.    Skin: Skin is warm and dry.  Psychiatric: She has a normal mood and affect. Her behavior is normal.          Assessment & Plan:  Sjogrens. - will make [facility].   Epigastric pain -  still unclear etiology. She took the PPI for him is 3 months for gastritis and her symptoms did not improve. It sometimes seems to be associated with the back pain that she experiences. But not always. It still could be musculoskeletal and positional somewhat. It does not sound/appear to be diaphragmatic. She's not having any  shortness of breath or respiratory problems. Consider esophageal spasms. She's not having any dysphasia.  Left-sided, upper back pain-recommend repeat physical therapy since it was helpful in the past. We could consider an MRI since her pain has been ongoing for several years. Consider that it could be somewhat discogenic. For now though I think we should try conservative care and if not improving after 4-6 weeks of physical therapy then consider further evaluation with imaging.

## 2013-12-25 ENCOUNTER — Encounter: Payer: Self-pay | Admitting: Family Medicine

## 2014-05-29 ENCOUNTER — Encounter: Payer: Self-pay | Admitting: Obstetrics & Gynecology

## 2014-05-30 ENCOUNTER — Other Ambulatory Visit: Payer: Self-pay | Admitting: Obstetrics & Gynecology

## 2014-05-30 ENCOUNTER — Encounter: Payer: Self-pay | Admitting: Obstetrics & Gynecology

## 2014-05-30 DIAGNOSIS — L659 Nonscarring hair loss, unspecified: Secondary | ICD-10-CM

## 2014-06-01 ENCOUNTER — Other Ambulatory Visit (INDEPENDENT_AMBULATORY_CARE_PROVIDER_SITE_OTHER): Payer: Managed Care, Other (non HMO)

## 2014-06-01 DIAGNOSIS — L659 Nonscarring hair loss, unspecified: Secondary | ICD-10-CM

## 2014-06-01 LAB — TSH: TSH: 1.757 u[IU]/mL (ref 0.350–4.500)

## 2014-06-02 ENCOUNTER — Other Ambulatory Visit: Payer: Self-pay | Admitting: Obstetrics & Gynecology

## 2014-06-02 LAB — TESTOSTERONE

## 2014-06-04 NOTE — Telephone Encounter (Signed)
Incoming Refill Request from [facility]: Temazepam 15mg   Last AEX:10/02/13 Last Refill:10/02/13 #30 X 1 Next AEX: 10/12/14   Please Advise

## 2014-06-06 NOTE — Telephone Encounter (Signed)
RX has been faxed Encounter closed

## 2014-06-11 ENCOUNTER — Encounter: Payer: Self-pay | Admitting: Family Medicine

## 2014-06-12 ENCOUNTER — Encounter: Payer: Self-pay | Admitting: Family Medicine

## 2014-06-12 ENCOUNTER — Ambulatory Visit (INDEPENDENT_AMBULATORY_CARE_PROVIDER_SITE_OTHER): Payer: Managed Care, Other (non HMO) | Admitting: Family Medicine

## 2014-06-12 ENCOUNTER — Telehealth: Payer: Self-pay | Admitting: Family Medicine

## 2014-06-12 VITALS — BP 114/69 | HR 66 | Ht 68.0 in | Wt 125.0 lb

## 2014-06-12 DIAGNOSIS — G8929 Other chronic pain: Secondary | ICD-10-CM | POA: Insufficient documentation

## 2014-06-12 DIAGNOSIS — Q6100 Congenital renal cyst, unspecified: Secondary | ICD-10-CM

## 2014-06-12 DIAGNOSIS — R208 Other disturbances of skin sensation: Secondary | ICD-10-CM

## 2014-06-12 DIAGNOSIS — N281 Cyst of kidney, acquired: Secondary | ICD-10-CM

## 2014-06-12 DIAGNOSIS — R1013 Epigastric pain: Secondary | ICD-10-CM

## 2014-06-12 DIAGNOSIS — Z0000 Encounter for general adult medical examination without abnormal findings: Secondary | ICD-10-CM

## 2014-06-12 DIAGNOSIS — R200 Anesthesia of skin: Secondary | ICD-10-CM

## 2014-06-12 DIAGNOSIS — R21 Rash and other nonspecific skin eruption: Secondary | ICD-10-CM

## 2014-06-12 NOTE — Patient Instructions (Signed)
Keep up a regular exercise program and make sure you are eating a healthy diet Try to eat 4 servings of dairy a day, or if you are lactose intolerant take a calcium with vitamin D daily.  Your vaccines are up to date.   

## 2014-06-12 NOTE — Telephone Encounter (Signed)
She is still waiting on referral for [facility].  WE placed referral and she hasn't heard anything so far.

## 2014-06-12 NOTE — Progress Notes (Signed)
Subjective:     Marissa Underwood is a 53 y.o. female and is here for a comprehensive physical exam. The patient reports problems - numbness in her feet. worse on the left.  Also has a lesion on the right forearm.  also having some left shoudler pain. .Says the numbness in her feet starts after she sits for about 5 minutes.  Happens in both feet but worse on the left.  Has noticed some tightness in her hamstrings.  She is doing PT for her upper back and her neck.  Also feels like needs help with foot position. Feels like every since had fracture in her left foot started walking akwardly.   She is still waiting on referral for [facility]. Jenelle [facility]agesJohn Green [facility].  WE placed referral and she hasn't heard anything so far.    History   Social History  . Marital Status: Married    Spouse Name: N/A    Number of Children: 2  . Years of Education: N/A   Occupational History  . Teach yoga    Social History Main Topics  . Smoking status: Never Smoker   . Smokeless tobacco: Never Used  . Alcohol Use: No  . Drug Use: No  . Sexual Activity:    Partners: Male    Birth Control/ Protection: Other-see comments     Comment: yoga instructor, self-employed, married, 2 kids, regularly exercises. husband with vasectomy   Other Topics Concern  . Not on file   Social History Narrative   Exercises regularly.  Caffeine 1  A day.    Health Maintenance  Topic Date Due  . INFLUENZA VACCINE  03/10/2014  . MAMMOGRAM  11/14/2015  . PAP SMEAR  10/02/2016  . TETANUS/TDAP  08/31/2022  . COLONOSCOPY  06/14/2023    The following portions of the patient's history were reviewed and updated as appropriate: allergies, current medications, past family history, past medical history, past social history, past surgical history and problem list.  Review of Systems A comprehensive review of systems was negative.   Objective:    BP 114/69 mmHg  Pulse 66  Ht 5\' 8"  (1.727 m)  Wt 125 lb (56.7 kg)  BMI 19.01 kg/m2  LMP  03/10/2013 General appearance: alert, cooperative and appears stated age Head: Normocephalic, without obvious abnormality, atraumatic Eyes: conj clear, EOMI, PEERLA Ears: normal TM's and external ear canals both ears Nose: Nares normal. Septum midline. Mucosa normal. No drainage or sinus tenderness. Throat: lips, mucosa, and tongue normal; teeth and gums normal Neck: no adenopathy, no carotid bruit, no JVD, supple, symmetrical, trachea midline and thyroid not enlarged, symmetric, no tenderness/mass/nodules Back: symmetric, no curvature. ROM normal. No CVA tenderness. Lungs: clear to auscultation bilaterally Heart: regular rate and rhythm, S1, S2 normal, no murmur, click, rub or gallop Abdomen: soft, non-tender; bowel sounds normal; no masses,  no organomegaly Extremities: extremities normal, atraumatic, no cyanosis or edema Pulses: 2+ and symmetric Skin: Skin color, texture, turgor normal. No rashes or lesions Lymph nodes: Cervical adenopathy: nl and Axillary adenopathy: nl Neurologic: Alert and oriented X 3, normal strength and tone. Normal symmetric reflexes. Normal coordination and gait    Assessment:    Healthy female exam.      Plan:     See After Visit Summary for Counseling Recommendations   Keep up a regular exercise program and make sure you are eating a healthy diet Try to eat 4 servings of dairy a day, or if you are lactose intolerant take a  calcium with vitamin D daily.  Your vaccines are up to date.   Referal for custom orthotic to sports med.  She will schedule Also add hip and low back to her current PT for her neck and shoulder. If not improving then consider MRI of lumbar spine since numbness in feet is positional.   Numbness in feet - Will check for deficiencies but seem to be positional.    Renal cysts - will order US to follow-up renal cysts.

## 2014-06-13 LAB — CBC
HCT: 43 % (ref 36.0–46.0)
Hemoglobin: 14.2 g/dL (ref 12.0–15.0)
MCH: 30.3 pg (ref 26.0–34.0)
MCHC: 33 g/dL (ref 30.0–36.0)
MCV: 91.9 fL (ref 78.0–100.0)
Platelets: 332 10*3/uL (ref 150–400)
RBC: 4.68 MIL/uL (ref 3.87–5.11)
RDW: 13.1 % (ref 11.5–15.5)
WBC: 5.1 10*3/uL (ref 4.0–10.5)

## 2014-06-13 NOTE — Telephone Encounter (Signed)
I called at  [facility] and was told that patient was contacted(byMorgan) but never returned the called. Patient will need to contact them herself. I left phone contact on patient's vm.

## 2014-06-14 ENCOUNTER — Encounter: Payer: Self-pay | Admitting: Family Medicine

## 2014-06-14 LAB — LIPID PANEL
CHOL/HDL RATIO: 2.5 ratio
Cholesterol: 160 mg/dL (ref 0–200)
HDL: 64 mg/dL (ref >39)
LDL CALC: 87 mg/dL (ref 0–99)
Triglycerides: 45 mg/dL (ref <150)
VLDL: 9 mg/dL (ref 0–40)

## 2014-06-14 LAB — COMPLETE METABOLIC PANEL WITH GFR
ALK PHOS: 97 U/L (ref 39–117)
ALT: 12 U/L (ref 0–35)
AST: 20 U/L (ref 0–37)
Albumin: 3.9 g/dL (ref 3.5–5.2)
BUN: 9 mg/dL (ref 6–23)
CO2: 26 mEq/L (ref 19–32)
Calcium: 9.5 mg/dL (ref 8.4–10.5)
Chloride: 103 mEq/L (ref 96–112)
Creat: 0.82 mg/dL (ref 0.50–1.10)
GFR, EST NON AFRICAN AMERICAN: 81 mL/min
GFR, Est African American: >89 mL/min
Glucose, Bld: 81 mg/dL (ref 70–99)
POTASSIUM: 4.4 meq/L (ref 3.5–5.3)
Sodium: 139 mEq/L (ref 135–145)
Total Bilirubin: 0.7 mg/dL (ref 0.2–1.2)
Total Protein: 6.7 g/dL (ref 6.0–8.3)

## 2014-06-14 LAB — FOLATE: FOLATE: 12.2 ng/mL

## 2014-06-14 LAB — VITAMIN B12: Vitamin B-12: 450 pg/mL (ref 211–911)

## 2014-06-14 LAB — KOH PREP: RESULT - KOH: NONE SEEN

## 2014-06-14 LAB — FERRITIN: Ferritin: 34 ng/mL (ref 10–291)

## 2014-06-14 LAB — MAGNESIUM: Magnesium: 1.9 mg/dL (ref 1.5–2.5)

## 2014-06-15 ENCOUNTER — Other Ambulatory Visit: Payer: Self-pay | Admitting: Family Medicine

## 2014-06-15 MED ORDER — TRIAMCINOLONE ACETONIDE 0.5 % EX OINT: 1.0000 "application " | TOPICAL_OINTMENT | Freq: Every day | CUTANEOUS | Status: AC

## 2014-06-17 ENCOUNTER — Other Ambulatory Visit: Payer: Self-pay | Admitting: Family Medicine

## 2014-06-17 DIAGNOSIS — M546 Pain in thoracic spine: Secondary | ICD-10-CM

## 2014-06-19 ENCOUNTER — Other Ambulatory Visit: Payer: Self-pay | Admitting: Family Medicine

## 2014-06-19 ENCOUNTER — Encounter: Payer: Self-pay | Admitting: Family Medicine

## 2014-06-19 DIAGNOSIS — M545 Low back pain, unspecified: Secondary | ICD-10-CM

## 2014-06-19 DIAGNOSIS — M25579 Pain in unspecified ankle and joints of unspecified foot: Secondary | ICD-10-CM

## 2014-06-19 DIAGNOSIS — R200 Anesthesia of skin: Secondary | ICD-10-CM

## 2014-06-20 ENCOUNTER — Telehealth: Payer: Self-pay | Admitting: *Deleted

## 2014-06-20 ENCOUNTER — Ambulatory Visit (INDEPENDENT_AMBULATORY_CARE_PROVIDER_SITE_OTHER): Payer: Managed Care, Other (non HMO)

## 2014-06-20 DIAGNOSIS — N281 Cyst of kidney, acquired: Secondary | ICD-10-CM

## 2014-06-20 DIAGNOSIS — Z09 Encounter for follow-up examination after completed treatment for conditions other than malignant neoplasm: Secondary | ICD-10-CM

## 2014-06-20 NOTE — Telephone Encounter (Signed)
Thoracic MRI approval expires 08/17/14. Valid for [facility] [phone]- [id number]. Corliss SkainsJamie Haakon Titsworth, CMA

## 2014-06-28 ENCOUNTER — Telehealth: Payer: Self-pay | Admitting: Family Medicine

## 2014-06-28 DIAGNOSIS — M546 Pain in thoracic spine: Secondary | ICD-10-CM

## 2014-06-28 NOTE — Telephone Encounter (Signed)
Please call patient: I have the results of her thoracic spine MRI. Essentially it was normal. No masses or lesions. No disc protrusion. No fractures. Nothing to explain her pain as far as something coming from her spinal cord or vertebrae. She was noted to have a lesion on the liver. Most likely a benign cyst but they did recommend an ultrasound of the abdomen to further characterize this area. I would like for her to consider seeing our sports medicine not just for a 2nd opinion and to see what he thinks. Please also let her know that her insurance denied the MRI of the lumbar spine. So we will not be able to move forward with this at this time.

## 2014-06-28 NOTE — Telephone Encounter (Signed)
Pt informed and she had US of her abdomen done last week. She reports that she has been going to PT and has an appt scheduled with Dr. Karie Schwalbe on Monday.Marissa PacasBarkley, Naseem [place] Terr[place] Park[place]

## 2014-06-29 ENCOUNTER — Other Ambulatory Visit: Payer: Self-pay

## 2014-06-29 NOTE — Telephone Encounter (Signed)
If she has time I would encourage her to get a hard copy of the MRI on a disc. This should be free and she should be able to get it at the location she had imaging done. I would encourage her to bring that to her office visit with Dr. Benjamin Stainhekkekandam so he can actually look at the images. Occasionally he is able to pick up on some subtleties that might affect care

## 2014-06-29 NOTE — Telephone Encounter (Addendum)
Left detailed message for patient.   Angie from [facility] called and wanted a copy of MRI report faxed to [phone]. Faxed MRI results.

## 2014-07-02 ENCOUNTER — Ambulatory Visit (INDEPENDENT_AMBULATORY_CARE_PROVIDER_SITE_OTHER): Payer: Managed Care, Other (non HMO) | Admitting: Sports Medicine

## 2014-07-02 ENCOUNTER — Encounter: Payer: Self-pay | Admitting: Sports Medicine

## 2014-07-02 ENCOUNTER — Ambulatory Visit (INDEPENDENT_AMBULATORY_CARE_PROVIDER_SITE_OTHER): Payer: Managed Care, Other (non HMO)

## 2014-07-02 VITALS — BP 114/71 | HR 72 | Wt 123.0 lb

## 2014-07-02 DIAGNOSIS — M5416 Radiculopathy, lumbar region: Secondary | ICD-10-CM

## 2014-07-02 DIAGNOSIS — M5137 Other intervertebral disc degeneration, lumbosacral region: Secondary | ICD-10-CM

## 2014-07-02 NOTE — Assessment & Plan Note (Addendum)
Right-sided L5 with discogenic back pain. Continue formal physical therapy, her patient request we will avoid medications for now. Lumbar spine x-rays, and MRI. We should try solid 6 weeks of physical therapy before considering medication or interventional treatment If fails physical therapy he can try oral steroids before pursuing interventional treatment.

## 2014-07-02 NOTE — Progress Notes (Signed)
   Subjective:    I'm seeing this patient as a consultation for:  Dr. Nani Gasseratherine Metheney  CC: Foot numbness and pain  HPI: This is a pleasant 53 year old female, for the past several years she's had right-sided low back pain, with occasional radiation down the lateral thigh, skips the knee, and continues on the posterior and plantar aspect of the right foot, and occasionally the left. Pain is worse with sitting, with flexion, with Valsalva, and with driving in a car. She is currently doing some physical therapy but not for her lumbar spine. She does desire to use a nonpharmacologic approach. She was initially scheduled for custom orthotics. She did also have a thoracic spine MRI that was negative. No bowel or bladder dysfunction, saddle numbness.  Past medical history, Surgical history, Family history not pertinant except as noted below, Social history, Allergies, and medications have been entered into the medical record, reviewed, and no changes needed.   Review of Systems: No headache, visual changes, nausea, vomiting, diarrhea, constipation, dizziness, abdominal pain, skin rash, fevers, chills, night sweats, weight loss, swollen lymph nodes, body aches, joint swelling, muscle aches, chest pain, shortness of breath, mood changes, visual or auditory hallucinations.   Objective:   General: Well Developed, well nourished, and in no acute distress.  Neuro/Psych: Alert and oriented x3, extra-ocular muscles intact, able to move all 4 extremities, sensation grossly intact. Skin: Warm and dry, no rashes noted.  Respiratory: Not using accessory muscles, speaking in full sentences, trachea midline.  Cardiovascular: Pulses palpable, no extremity edema. Abdomen: Does not appear distended. Bilateral feet: No visible erythema or swelling. Range of motion is full in all directions. Strength is 5/5 in all directions. No hallux valgus. No pes cavus or pes planus. No abnormal callus noted. No pain over  the navicular prominence, or base of fifth metatarsal. No tenderness to palpation of the calcaneal insertion of plantar fascia. No pain at the Achilles insertion. No pain over the calcaneal bursa. No pain of the retrocalcaneal bursa. No tenderness to palpation over the tarsals, metatarsals, or phalanges. No hallux rigidus or limitus. No tenderness palpation over interphalangeal joints. No pain with compression of the metatarsal heads. Neurovascularly intact distally. Back Exam:  Inspection: Unremarkable  Motion: Flexion 45 deg, Extension 45 deg, Side Bending to 45 deg bilaterally,  Rotation to 45 deg bilaterally  SLR laying: Negative  XSLR laying: Negative  Palpable tenderness: None. FABER: negative. Sensory change: Gross sensation intact to all lumbar and sacral dermatomes.  Reflexes: 2+ at both patellar tendons and both Achilles tendons with Babinski's downgoing.  Strength at foot  Plantar-flexion: 5/5 Dorsi-flexion: 5/5 Eversion: 5/5 Inversion: 5/5  Leg strength  Quad: 5/5 Hamstring: 5/5 Hip flexor: 5/5 Hip abductors: 5/5  Gait unremarkable.  Thoracic spine MRI was unremarkable.  Impression and Recommendations:   This case required medical decision making of moderate complexity.

## 2014-07-19 ENCOUNTER — Telehealth: Payer: Self-pay

## 2014-07-19 NOTE — Telephone Encounter (Signed)
Patient called stated that her MRI was denied by the insurance she wants to know what she should do at this point. Irvan Tiedt,CMA

## 2014-07-19 NOTE — Telephone Encounter (Signed)
We are going to finish 6 solid weeks of PT first, getting an MRI wouldn't change that since we only do those for procedural planning and we are not planning any procedures unless PT fails.  We can try again after 6 weeks of PT and it will likely be covered then.  Often they want the 6 weeks FORMAL PT before approving.

## 2014-07-20 NOTE — Telephone Encounter (Signed)
Spoke to patient gave her information as noted below. Marissa Underwood,CMA  

## 2014-10-03 ENCOUNTER — Ambulatory Visit (INDEPENDENT_AMBULATORY_CARE_PROVIDER_SITE_OTHER): Payer: Managed Care, Other (non HMO) | Admitting: Sports Medicine

## 2014-10-03 ENCOUNTER — Encounter: Payer: Self-pay | Admitting: Sports Medicine

## 2014-10-03 VITALS — BP 105/68 | HR 90 | Ht 68.0 in | Wt 123.0 lb

## 2014-10-03 DIAGNOSIS — M5030 Other cervical disc degeneration, unspecified cervical region: Secondary | ICD-10-CM

## 2014-10-03 DIAGNOSIS — M5416 Radiculopathy, lumbar region: Secondary | ICD-10-CM

## 2014-10-03 NOTE — Assessment & Plan Note (Signed)
With bilateral C6 distribution radicular symptoms. Positive Spurling's test. She did have a negative Hoffmann sign. She has failed greater than 6 weeks of physical therapy and NSAIDs. At this point we are going to proceed with an x-ray, and subsequently a C-spine MRI. Certainly gabapentin could help.

## 2014-10-03 NOTE — Progress Notes (Signed)
  Subjective:    CC: Numbness in hands  HPI: This is a pleasant 54 year old female, she is very active and desires to avoid pharmacologic intervention if possible. For the past couple of months she's had numbness and tingling that she localizes in both hands, in the first through third digits, with occasional stiffness in her neck. Symptoms are moderate, persistent, denies any lower extreme he symptoms with the exception of occasional numbness in her left lower extremity, radiating to the lateral thigh but not past the knee. No bowel or bladder dysfunction, saddle numbness, or constitutional symptoms. [place], and requests that if we do any advanced imaging or therapy we do it in [place].  Past medical history, Surgical history, Family history not pertinant except as noted below, Social history, Allergies, and medications have been entered into the medical record, reviewed, and no changes needed.   Review of Systems: No fevers, chills, night sweats, weight loss, chest pain, or shortness of breath.   Objective:    General: Well Developed, well nourished, and in no acute distress.  Neuro: Alert and oriented x3, extra-ocular muscles intact, sensation grossly intact.  HEENT: Normocephalic, atraumatic, pupils equal round reactive to light, neck supple, no masses, no lymphadenopathy, thyroid nonpalpable.  Skin: Warm and dry, no rashes. Cardiac: Regular rate and rhythm, no murmurs rubs or gallops, no lower extremity edema.  Respiratory: Clear to auscultation bilaterally. Not using accessory muscles, speaking in full sentences. Neck: Negative spurling's Full neck range of motion Grip strength and sensation normal in bilateral hands Strength good C4 to T1 distribution No sensory change to C4 to T1 Reflexes normal  Lumbar spine x-rays show L5-S1 degenerative disc disease.  Impression and Recommendations:

## 2014-10-03 NOTE — Assessment & Plan Note (Signed)
At this point she has failed greater than 6 weeks of physical therapy, NSAIDs. We are going to obtain an MRI of her lumbar spine, x-rays did show L5-S1 degenerative changes.  Symptoms are predominantly left-sided and L5 versus S1. Patient does desire to proceed initially with a nonpharmacologic approach however we can certainly add gabapentin if needed. Return for MRI results.

## 2014-10-11 ENCOUNTER — Ambulatory Visit: Payer: Managed Care, Other (non HMO) | Admitting: Family Medicine

## 2014-10-12 ENCOUNTER — Ambulatory Visit: Payer: Managed Care, Other (non HMO) | Admitting: Obstetrics & Gynecology

## 2014-10-30 ENCOUNTER — Ambulatory Visit (INDEPENDENT_AMBULATORY_CARE_PROVIDER_SITE_OTHER): Payer: Managed Care, Other (non HMO) | Admitting: Sports Medicine

## 2014-10-30 ENCOUNTER — Encounter: Payer: Self-pay | Admitting: Sports Medicine

## 2014-10-30 VITALS — BP 111/69 | HR 74 | Ht 68.0 in | Wt 122.0 lb

## 2014-10-30 DIAGNOSIS — M5030 Other cervical disc degeneration, unspecified cervical region: Secondary | ICD-10-CM | POA: Diagnosis not present

## 2014-10-30 DIAGNOSIS — M5416 Radiculopathy, lumbar region: Secondary | ICD-10-CM

## 2014-10-30 NOTE — Assessment & Plan Note (Signed)
Again, improving significantly with physical therapy and we will do this for 6 more weeks. The next step would be addition of gabapentin. Still no improvement epidural would take place on the left, she does have left C6 radicular symptoms, less so on the right.

## 2014-10-30 NOTE — Assessment & Plan Note (Signed)
Improving significantly with physical therapy. Has not plateaued. Continue rehabilitation for at least another 6 weeks before considering any other form of treatment, she does want to avoid medical or interventional treatment if possible. Next step would be gabapentin, and if still no better a left-sided L4-L5 epidural. Symptoms have evolved into right-sided axial pain and left-sided L4 radicular pain going to the first 2 toes of the left foot.

## 2014-10-30 NOTE — Progress Notes (Signed)
  Subjective:    CC: MRI results  HPI: Left worse than right bilateral cervical radiculopathy: MRI did show a C5-C6 protrusion indenting the cord, symptoms continued to improve with physical therapy.  Right-sided axial and left-sided L4 radicular pain, MRI also showed an L4-L5 disc protrusion, again removing improving significantly with physical therapy.  Past medical history, Surgical history, Family history not pertinant except as noted below, Social history, Allergies, and medications have been entered into the medical record, reviewed, and no changes needed.   Review of Systems: No fevers, chills, night sweats, weight loss, chest pain, or shortness of breath.   Objective:    General: Well Developed, well nourished, and in no acute distress.  Neuro: Alert and oriented x3, extra-ocular muscles intact, sensation grossly intact.  HEENT: Normocephalic, atraumatic, pupils equal round reactive to light, neck supple, no masses, no lymphadenopathy, thyroid nonpalpable.  Skin: Warm and dry, no rashes. Cardiac: Regular rate and rhythm, no murmurs rubs or gallops, no lower extremity edema.  Respiratory: Clear to auscultation bilaterally. Not using accessory muscles, speaking in full sentences.  Impression and Recommendations:

## 2016-11-20 IMAGING — CR DG LUMBAR SPINE COMPLETE 4+V
5 series · 5 of 5 positions shown · non-contrast
Comparison: None.

CLINICAL DATA: Right side lower back pain, denies injury

EXAM:
LUMBAR SPINE - COMPLETE 4+ VIEW

[view not recorded (1 of 5)]
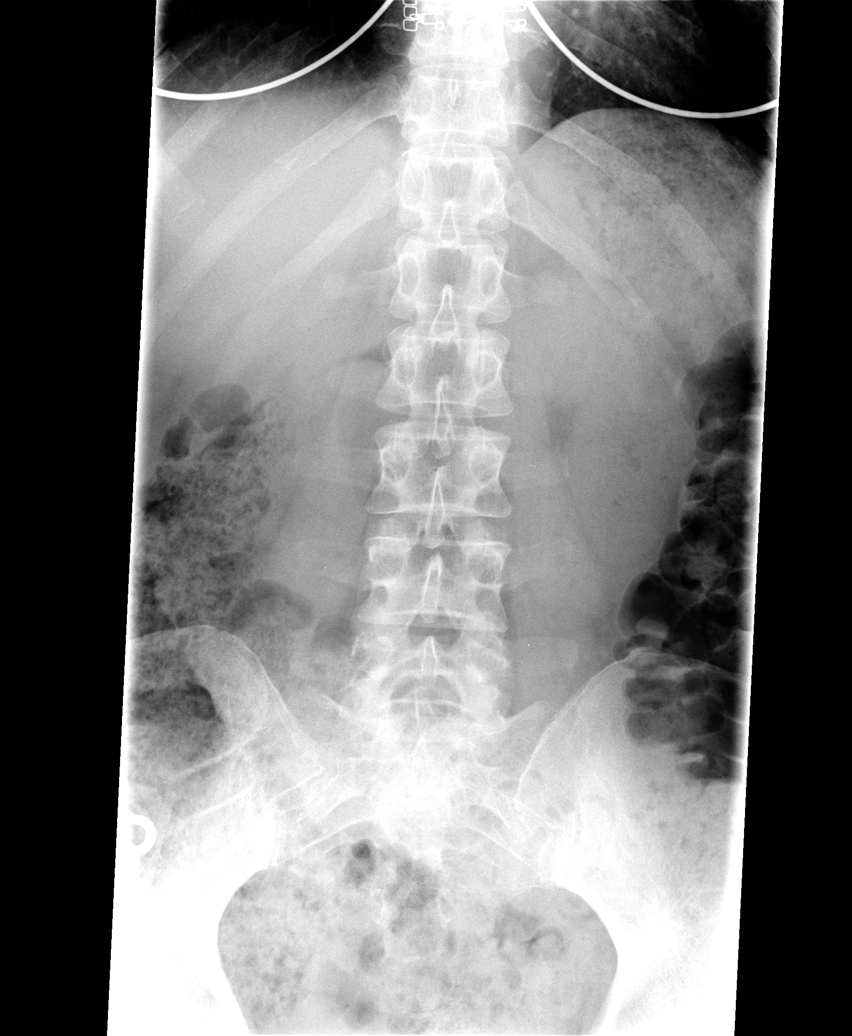

[view not recorded (2 of 5)]
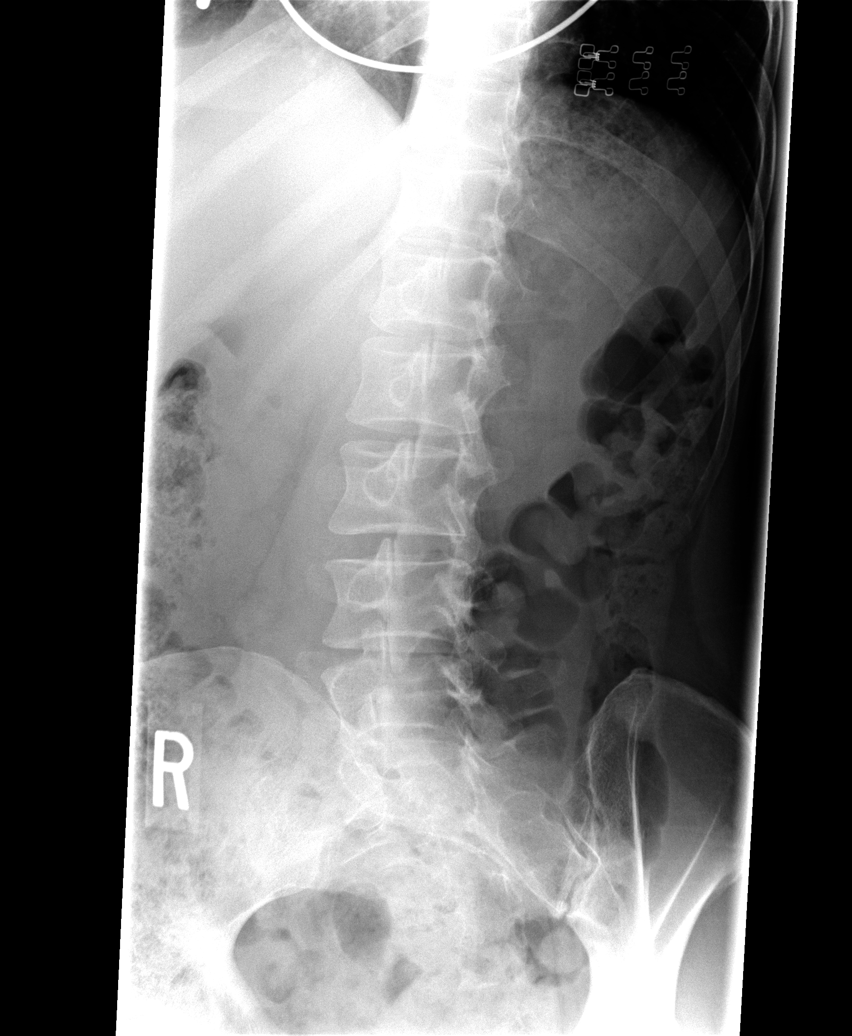

[view not recorded (3 of 5)]
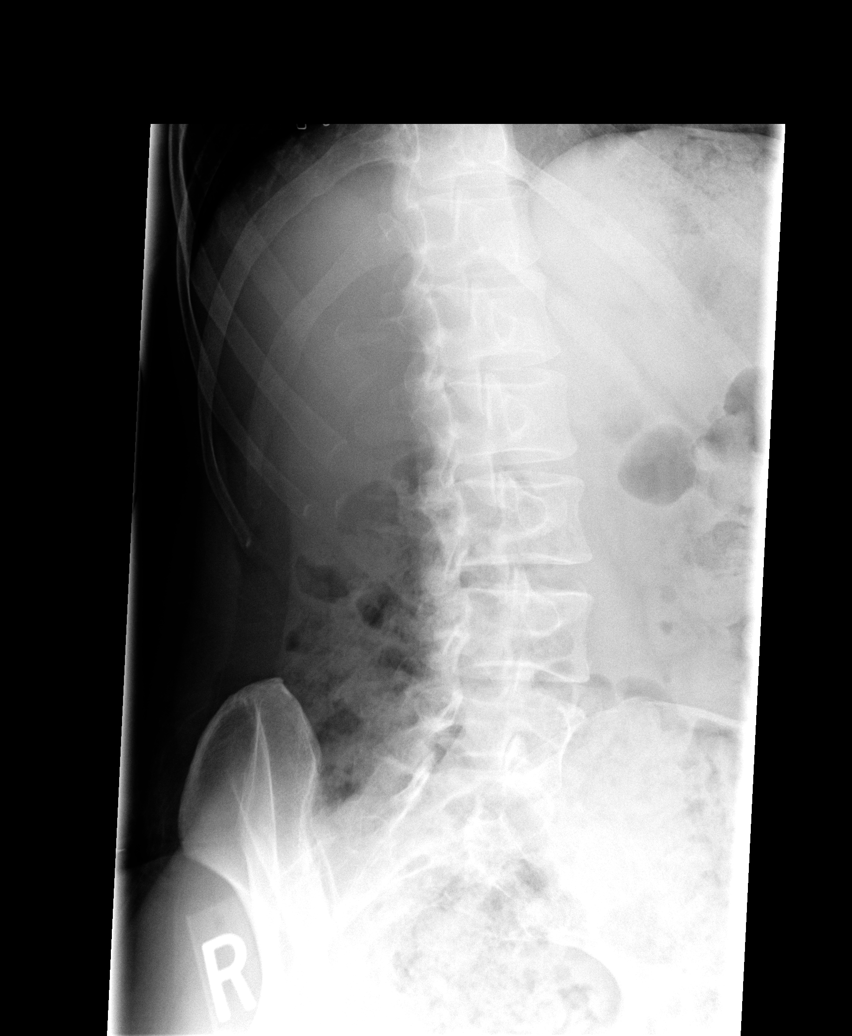

[view not recorded (4 of 5)]
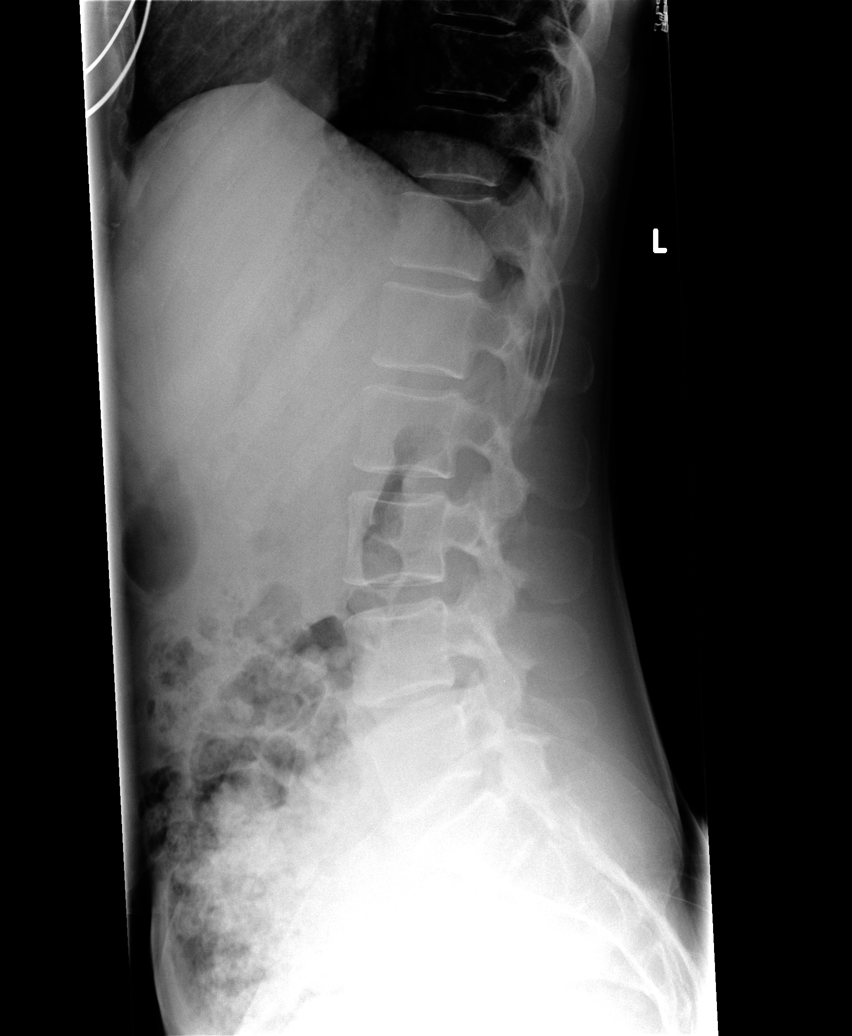

[view not recorded (5 of 5)]
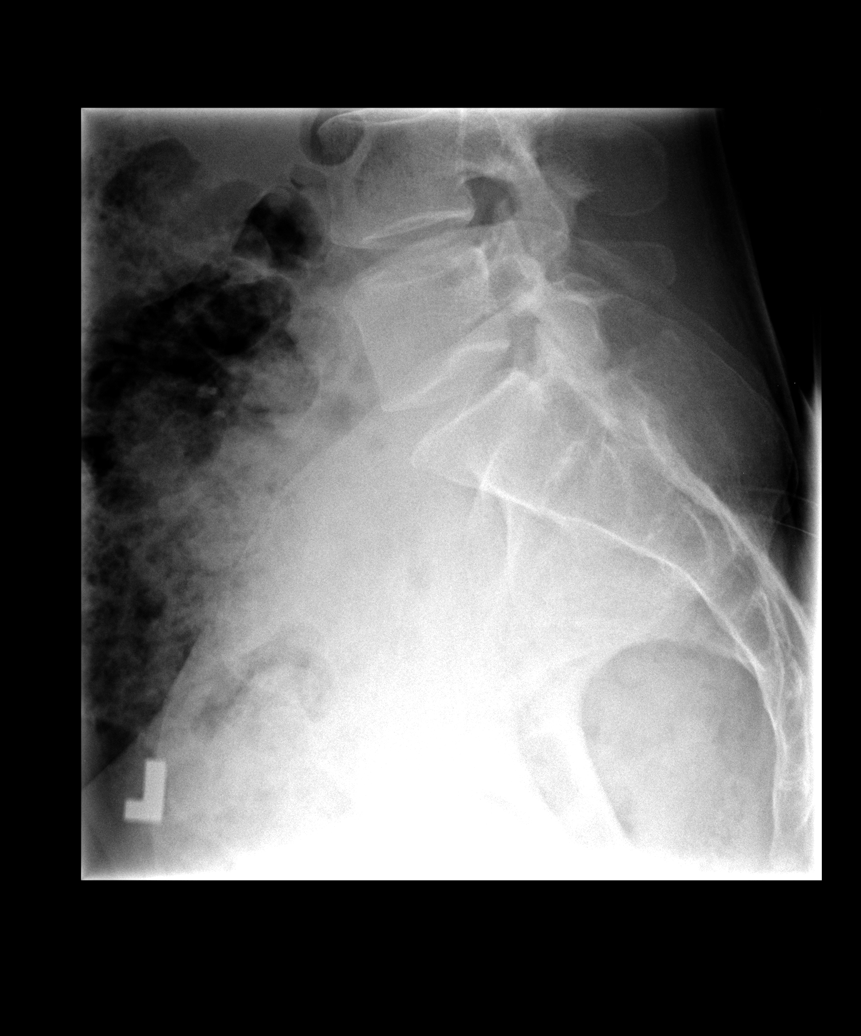

[5 of 5 positions shown; findings below may reference images not displayed]

FINDINGS: Five views of lumbar spine submitted. No acute fracture or
subluxation. Alignment and vertebral body heights are preserved.
Mild disc space flattening at L5-S1 level. Moderate stool throughout
the colon.
IMPRESSION: No acute fracture or subluxation. Mild disc space flattening at
L5-S1 level.
